# Patient Record
Sex: Female | Born: 1935 | Race: White | Hispanic: No | Smoking: Never smoker
Health system: Southern US, Community
[De-identification: ages and names within clinical notes are randomized; demographics above are authoritative.]

## PROBLEM LIST (undated history)

## (undated) DIAGNOSIS — I10 Essential (primary) hypertension: Secondary | ICD-10-CM

## (undated) DIAGNOSIS — I4891 Unspecified atrial fibrillation: Secondary | ICD-10-CM

## (undated) DIAGNOSIS — E039 Hypothyroidism, unspecified: Secondary | ICD-10-CM

## (undated) DIAGNOSIS — E785 Hyperlipidemia, unspecified: Secondary | ICD-10-CM

## (undated) DIAGNOSIS — S22000A Wedge compression fracture of unspecified thoracic vertebra, initial encounter for closed fracture: Secondary | ICD-10-CM

## (undated) HISTORY — PX: FRACTURE SURGERY: SHX138

## (undated) HISTORY — DX: Wedge compression fracture of unspecified thoracic vertebra, initial encounter for closed fracture: S22.000A

## (undated) HISTORY — PX: OTHER SURGICAL HISTORY: SHX169

## (undated) HISTORY — DX: Hyperlipidemia, unspecified: E78.5

## (undated) HISTORY — DX: Essential (primary) hypertension: I10

## (undated) HISTORY — DX: Unspecified atrial fibrillation: I48.91

## (undated) HISTORY — DX: Hypothyroidism, unspecified: E03.9

---
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
## 1968-04-13 HISTORY — PX: OTHER SURGICAL HISTORY: SHX169

## 1978-04-13 HISTORY — PX: SKIN CANCER EXCISION: SHX779

## 1992-04-13 DIAGNOSIS — E039 Hypothyroidism, unspecified: Secondary | ICD-10-CM

## 1992-04-13 HISTORY — DX: Hypothyroidism, unspecified: E03.9

## 1995-04-14 DIAGNOSIS — E785 Hyperlipidemia, unspecified: Secondary | ICD-10-CM

## 1995-04-14 HISTORY — DX: Hyperlipidemia, unspecified: E78.5

## 1998-06-27 ENCOUNTER — Other Ambulatory Visit: Admission: RE | Admit: 1998-06-27 | Discharge: 1998-06-27 | Payer: Self-pay | Admitting: Family Medicine

## 1999-05-15 HISTORY — PX: OTHER SURGICAL HISTORY: SHX169

## 2003-06-13 HISTORY — PX: DOPPLER ECHOCARDIOGRAPHY: SHX263

## 2003-09-27 HISTORY — PX: OTHER SURGICAL HISTORY: SHX169

## 2004-03-10 ENCOUNTER — Ambulatory Visit: Payer: Self-pay | Admitting: Family Medicine

## 2004-03-26 ENCOUNTER — Ambulatory Visit: Payer: Self-pay | Admitting: Family Medicine

## 2004-05-20 ENCOUNTER — Ambulatory Visit: Payer: Self-pay | Admitting: Family Medicine

## 2004-05-27 ENCOUNTER — Ambulatory Visit: Payer: Self-pay | Admitting: Family Medicine

## 2004-07-24 ENCOUNTER — Ambulatory Visit: Payer: Self-pay | Admitting: Family Medicine

## 2004-08-07 ENCOUNTER — Ambulatory Visit: Payer: Self-pay | Admitting: Family Medicine

## 2004-11-13 ENCOUNTER — Ambulatory Visit: Payer: Self-pay | Admitting: Family Medicine

## 2004-11-27 ENCOUNTER — Ambulatory Visit: Payer: Self-pay | Admitting: Family Medicine

## 2004-12-01 HISTORY — PX: OTHER SURGICAL HISTORY: SHX169

## 2004-12-05 ENCOUNTER — Encounter (INDEPENDENT_AMBULATORY_CARE_PROVIDER_SITE_OTHER): Payer: Self-pay | Admitting: Specialist

## 2004-12-05 ENCOUNTER — Inpatient Hospital Stay (HOSPITAL_COMMUNITY): Admission: RE | Admit: 2004-12-05 | Discharge: 2004-12-08 | Payer: Self-pay | Admitting: Gynecology

## 2004-12-30 ENCOUNTER — Ambulatory Visit: Payer: Self-pay | Admitting: Family Medicine

## 2004-12-31 ENCOUNTER — Ambulatory Visit: Payer: Self-pay | Admitting: Family Medicine

## 2005-01-30 ENCOUNTER — Ambulatory Visit: Payer: Self-pay | Admitting: Family Medicine

## 2005-04-01 ENCOUNTER — Ambulatory Visit: Payer: Self-pay | Admitting: Family Medicine

## 2005-04-15 ENCOUNTER — Ambulatory Visit: Payer: Self-pay | Admitting: Family Medicine

## 2005-05-06 ENCOUNTER — Ambulatory Visit: Payer: Self-pay | Admitting: Family Medicine

## 2005-08-24 ENCOUNTER — Ambulatory Visit: Payer: Self-pay | Admitting: Family Medicine

## 2005-09-24 ENCOUNTER — Ambulatory Visit: Payer: Self-pay | Admitting: Family Medicine

## 2005-12-08 ENCOUNTER — Ambulatory Visit: Payer: Self-pay | Admitting: Family Medicine

## 2005-12-28 ENCOUNTER — Ambulatory Visit: Payer: Self-pay | Admitting: Family Medicine

## 2006-01-06 ENCOUNTER — Ambulatory Visit: Payer: Self-pay | Admitting: Family Medicine

## 2006-02-24 ENCOUNTER — Emergency Department: Payer: Self-pay | Admitting: Unknown Physician Specialty

## 2006-02-24 HISTORY — PX: OTHER SURGICAL HISTORY: SHX169

## 2006-02-25 ENCOUNTER — Ambulatory Visit: Payer: Self-pay | Admitting: Family Medicine

## 2006-03-05 ENCOUNTER — Ambulatory Visit: Payer: Self-pay | Admitting: Unknown Physician Specialty

## 2006-06-14 ENCOUNTER — Ambulatory Visit: Payer: Self-pay | Admitting: Family Medicine

## 2006-07-18 ENCOUNTER — Encounter: Payer: Self-pay | Admitting: Family Medicine

## 2006-07-18 ENCOUNTER — Other Ambulatory Visit: Payer: Self-pay

## 2006-07-18 ENCOUNTER — Emergency Department: Payer: Self-pay | Admitting: Internal Medicine

## 2006-08-13 ENCOUNTER — Ambulatory Visit: Payer: Self-pay | Admitting: Family Medicine

## 2006-08-13 DIAGNOSIS — D508 Other iron deficiency anemias: Secondary | ICD-10-CM | POA: Insufficient documentation

## 2006-08-13 LAB — CONVERTED CEMR LAB
Glucose, Urine, Semiquant: NEGATIVE
HCT: 36.8 % (ref 36.0–46.0)
Ketones, urine, test strip: NEGATIVE
MCHC: 34.7 g/dL (ref 30.0–36.0)
MCV: 91.8 fL (ref 78.0–100.0)
Nitrite: NEGATIVE
Protein, U semiquant: NEGATIVE
WBC Urine, dipstick: NEGATIVE
WBC: 5.6 10*3/uL (ref 4.5–10.5)

## 2006-08-19 ENCOUNTER — Telehealth: Payer: Self-pay | Admitting: Family Medicine

## 2006-08-19 ENCOUNTER — Telehealth (INDEPENDENT_AMBULATORY_CARE_PROVIDER_SITE_OTHER): Payer: Self-pay | Admitting: *Deleted

## 2006-09-22 ENCOUNTER — Encounter: Payer: Self-pay | Admitting: Family Medicine

## 2006-09-22 DIAGNOSIS — N39490 Overflow incontinence: Secondary | ICD-10-CM | POA: Insufficient documentation

## 2006-09-23 ENCOUNTER — Ambulatory Visit: Payer: Self-pay | Admitting: Family Medicine

## 2006-09-23 DIAGNOSIS — E785 Hyperlipidemia, unspecified: Secondary | ICD-10-CM

## 2006-09-23 DIAGNOSIS — I10 Essential (primary) hypertension: Secondary | ICD-10-CM | POA: Insufficient documentation

## 2006-09-23 DIAGNOSIS — E039 Hypothyroidism, unspecified: Secondary | ICD-10-CM | POA: Insufficient documentation

## 2006-09-23 DIAGNOSIS — M810 Age-related osteoporosis without current pathological fracture: Secondary | ICD-10-CM | POA: Insufficient documentation

## 2006-09-23 LAB — CONVERTED CEMR LAB
Bilirubin Urine: NEGATIVE
Ketones, urine, test strip: NEGATIVE
Protein, U semiquant: NEGATIVE
Urobilinogen, UA: 1

## 2006-11-03 ENCOUNTER — Ambulatory Visit: Payer: Self-pay | Admitting: Family Medicine

## 2006-11-03 DIAGNOSIS — R1013 Epigastric pain: Secondary | ICD-10-CM | POA: Insufficient documentation

## 2006-11-04 ENCOUNTER — Encounter: Payer: Self-pay | Admitting: Family Medicine

## 2006-11-12 HISTORY — PX: TOTAL ABDOMINAL HYSTERECTOMY: SHX209

## 2006-11-26 ENCOUNTER — Telehealth (INDEPENDENT_AMBULATORY_CARE_PROVIDER_SITE_OTHER): Payer: Self-pay | Admitting: *Deleted

## 2006-12-06 ENCOUNTER — Ambulatory Visit: Payer: Self-pay | Admitting: Family Medicine

## 2006-12-13 HISTORY — PX: OTHER SURGICAL HISTORY: SHX169

## 2006-12-24 ENCOUNTER — Ambulatory Visit: Payer: Self-pay | Admitting: Family Medicine

## 2006-12-24 LAB — CONVERTED CEMR LAB
ALT: 15 units/L (ref 0–35)
Basophils Relative: 0.8 % (ref 0.0–1.0)
Bilirubin, Direct: 0.1 mg/dL (ref 0.0–0.3)
CO2: 31 meq/L (ref 19–32)
Calcium: 8.8 mg/dL (ref 8.4–10.5)
Eosinophils Absolute: 0.2 10*3/uL (ref 0.0–0.6)
Eosinophils Relative: 3.1 % (ref 0.0–5.0)
Free T4: 0.9 ng/dL (ref 0.6–1.6)
GFR calc Af Amer: 76 mL/min
GFR calc non Af Amer: 63 mL/min
Glucose, Bld: 93 mg/dL (ref 70–99)
Hemoglobin: 12.2 g/dL (ref 12.0–15.0)
Lymphocytes Relative: 26.5 % (ref 12.0–46.0)
MCV: 93.4 fL (ref 78.0–100.0)
Monocytes Absolute: 0.5 10*3/uL (ref 0.2–0.7)
Neutro Abs: 3 10*3/uL (ref 1.4–7.7)
Neutrophils Relative %: 60.2 % (ref 43.0–77.0)
Platelets: 178 10*3/uL (ref 150–400)
Potassium: 3.9 meq/L (ref 3.5–5.1)
Sodium: 142 meq/L (ref 135–145)
Total Protein: 6 g/dL (ref 6.0–8.3)
Triglycerides: 104 mg/dL (ref 0–149)
VLDL: 21 mg/dL (ref 0–40)
WBC: 5 10*3/uL (ref 4.5–10.5)

## 2006-12-27 ENCOUNTER — Ambulatory Visit: Payer: Self-pay | Admitting: Family Medicine

## 2006-12-27 DIAGNOSIS — R5383 Other fatigue: Secondary | ICD-10-CM

## 2006-12-27 DIAGNOSIS — M129 Arthropathy, unspecified: Secondary | ICD-10-CM | POA: Insufficient documentation

## 2006-12-27 DIAGNOSIS — R5381 Other malaise: Secondary | ICD-10-CM

## 2006-12-28 ENCOUNTER — Encounter: Payer: Self-pay | Admitting: Family Medicine

## 2006-12-30 ENCOUNTER — Encounter: Payer: Self-pay | Admitting: Family Medicine

## 2007-01-04 ENCOUNTER — Encounter: Admission: RE | Admit: 2007-01-04 | Discharge: 2007-01-04 | Payer: Self-pay | Admitting: Orthopaedic Surgery

## 2007-01-05 ENCOUNTER — Encounter: Admission: RE | Admit: 2007-01-05 | Discharge: 2007-01-05 | Payer: Self-pay | Admitting: Orthopaedic Surgery

## 2007-01-17 ENCOUNTER — Ambulatory Visit: Payer: Self-pay | Admitting: Family Medicine

## 2007-01-21 ENCOUNTER — Inpatient Hospital Stay (HOSPITAL_COMMUNITY): Admission: EM | Admit: 2007-01-21 | Discharge: 2007-01-25 | Payer: Self-pay | Admitting: Emergency Medicine

## 2007-01-24 ENCOUNTER — Encounter: Payer: Self-pay | Admitting: Family Medicine

## 2007-01-25 ENCOUNTER — Encounter: Payer: Self-pay | Admitting: Internal Medicine

## 2007-02-12 ENCOUNTER — Encounter: Payer: Self-pay | Admitting: Internal Medicine

## 2007-02-24 ENCOUNTER — Ambulatory Visit: Payer: Self-pay | Admitting: Family Medicine

## 2007-02-24 DIAGNOSIS — S72009A Fracture of unspecified part of neck of unspecified femur, initial encounter for closed fracture: Secondary | ICD-10-CM | POA: Insufficient documentation

## 2007-02-24 LAB — CONVERTED CEMR LAB
Basophils Absolute: 0 10*3/uL (ref 0.0–0.1)
Eosinophils Absolute: 0 10*3/uL (ref 0.0–0.6)
Eosinophils Relative: 0.7 % (ref 0.0–5.0)
HCT: 33.6 % — ABNORMAL LOW (ref 36.0–46.0)
MCV: 92.8 fL (ref 78.0–100.0)
Platelets: 271 10*3/uL (ref 150–400)
RBC: 3.62 M/uL — ABNORMAL LOW (ref 3.87–5.11)
RDW: 13.9 % (ref 11.5–14.6)
WBC: 6.1 10*3/uL (ref 4.5–10.5)

## 2007-05-12 ENCOUNTER — Ambulatory Visit: Payer: Self-pay | Admitting: Family Medicine

## 2007-05-12 DIAGNOSIS — K219 Gastro-esophageal reflux disease without esophagitis: Secondary | ICD-10-CM

## 2007-05-12 DIAGNOSIS — M549 Dorsalgia, unspecified: Secondary | ICD-10-CM | POA: Insufficient documentation

## 2007-05-12 LAB — CONVERTED CEMR LAB
Bilirubin Urine: NEGATIVE
Ketones, urine, test strip: NEGATIVE
Specific Gravity, Urine: 1.015
Urobilinogen, UA: 0.2

## 2007-05-15 DIAGNOSIS — S22000A Wedge compression fracture of unspecified thoracic vertebra, initial encounter for closed fracture: Secondary | ICD-10-CM

## 2007-05-15 HISTORY — DX: Wedge compression fracture of unspecified thoracic vertebra, initial encounter for closed fracture: S22.000A

## 2007-05-22 ENCOUNTER — Inpatient Hospital Stay (HOSPITAL_COMMUNITY): Admission: EM | Admit: 2007-05-22 | Discharge: 2007-05-26 | Payer: Self-pay | Admitting: Emergency Medicine

## 2007-05-22 ENCOUNTER — Ambulatory Visit: Payer: Self-pay | Admitting: Cardiology

## 2007-05-23 ENCOUNTER — Ambulatory Visit: Payer: Self-pay | Admitting: Vascular Surgery

## 2007-05-23 ENCOUNTER — Encounter: Payer: Self-pay | Admitting: Internal Medicine

## 2007-05-23 ENCOUNTER — Encounter: Payer: Self-pay | Admitting: Family Medicine

## 2007-05-23 HISTORY — PX: DOPPLER ECHOCARDIOGRAPHY: SHX263

## 2007-05-23 HISTORY — PX: OTHER SURGICAL HISTORY: SHX169

## 2007-05-24 ENCOUNTER — Ambulatory Visit: Payer: Self-pay | Admitting: Internal Medicine

## 2007-05-24 ENCOUNTER — Encounter: Payer: Self-pay | Admitting: Family Medicine

## 2007-05-26 ENCOUNTER — Encounter: Payer: Self-pay | Admitting: Family Medicine

## 2007-05-26 ENCOUNTER — Encounter: Payer: Self-pay | Admitting: Internal Medicine

## 2007-06-30 ENCOUNTER — Ambulatory Visit: Payer: Self-pay | Admitting: Family Medicine

## 2007-06-30 DIAGNOSIS — M8448XA Pathological fracture, other site, initial encounter for fracture: Secondary | ICD-10-CM | POA: Insufficient documentation

## 2007-07-01 ENCOUNTER — Encounter: Payer: Self-pay | Admitting: Family Medicine

## 2007-07-01 ENCOUNTER — Ambulatory Visit: Payer: Self-pay | Admitting: Pulmonary Disease

## 2007-08-02 ENCOUNTER — Ambulatory Visit: Payer: Self-pay | Admitting: Family Medicine

## 2007-08-02 LAB — CONVERTED CEMR LAB
BUN: 14 mg/dL (ref 6–23)
Basophils Relative: 0.1 % (ref 0.0–1.0)
CO2: 30 meq/L (ref 19–32)
Chloride: 103 meq/L (ref 96–112)
Creatinine, Ser: 0.8 mg/dL (ref 0.4–1.2)
Eosinophils Relative: 1.8 % (ref 0.0–5.0)
Glucose, Bld: 89 mg/dL (ref 70–99)
Hemoglobin: 12.2 g/dL (ref 12.0–15.0)
Lymphocytes Relative: 28.7 % (ref 12.0–46.0)
Neutrophils Relative %: 61.9 % (ref 43.0–77.0)
RBC: 3.89 M/uL (ref 3.87–5.11)
WBC: 5.4 10*3/uL (ref 4.5–10.5)

## 2007-08-03 ENCOUNTER — Other Ambulatory Visit: Payer: Self-pay

## 2007-08-03 ENCOUNTER — Ambulatory Visit: Payer: Self-pay | Admitting: Ophthalmology

## 2007-08-09 ENCOUNTER — Ambulatory Visit: Payer: Self-pay | Admitting: Family Medicine

## 2007-08-09 DIAGNOSIS — N3941 Urge incontinence: Secondary | ICD-10-CM

## 2007-08-15 ENCOUNTER — Ambulatory Visit: Payer: Self-pay | Admitting: Ophthalmology

## 2007-09-28 ENCOUNTER — Ambulatory Visit: Payer: Self-pay | Admitting: Ophthalmology

## 2007-10-10 ENCOUNTER — Ambulatory Visit: Payer: Self-pay | Admitting: Ophthalmology

## 2007-10-18 ENCOUNTER — Ambulatory Visit: Payer: Self-pay | Admitting: Family Medicine

## 2007-11-08 ENCOUNTER — Ambulatory Visit: Payer: Self-pay | Admitting: Family Medicine

## 2007-11-08 DIAGNOSIS — B029 Zoster without complications: Secondary | ICD-10-CM | POA: Insufficient documentation

## 2008-06-28 ENCOUNTER — Emergency Department (HOSPITAL_COMMUNITY): Admission: EM | Admit: 2008-06-28 | Discharge: 2008-06-28 | Payer: Self-pay | Admitting: Emergency Medicine

## 2008-06-28 ENCOUNTER — Encounter: Payer: Self-pay | Admitting: Family Medicine

## 2008-06-28 HISTORY — PX: CT HEAD LIMITED W/O CM: HXRAD127

## 2008-07-03 ENCOUNTER — Ambulatory Visit: Payer: Self-pay | Admitting: Family Medicine

## 2008-07-03 DIAGNOSIS — S20219A Contusion of unspecified front wall of thorax, initial encounter: Secondary | ICD-10-CM

## 2008-09-13 ENCOUNTER — Ambulatory Visit: Payer: Self-pay | Admitting: Family Medicine

## 2008-09-13 LAB — CONVERTED CEMR LAB
Bilirubin Urine: NEGATIVE
Ketones, urine, test strip: NEGATIVE
RBC / HPF: 0
Specific Gravity, Urine: <1.005
Urobilinogen, UA: 0.2
pH: 7

## 2008-09-14 ENCOUNTER — Encounter: Payer: Self-pay | Admitting: Family Medicine

## 2008-10-29 ENCOUNTER — Ambulatory Visit: Payer: Self-pay | Admitting: Family Medicine

## 2008-10-29 LAB — CONVERTED CEMR LAB
Albumin: 3.8 g/dL (ref 3.5–5.2)
Basophils Relative: 0.5 % (ref 0.0–3.0)
CO2: 31 meq/L (ref 19–32)
Chloride: 104 meq/L (ref 96–112)
Creatinine, Ser: 0.8 mg/dL (ref 0.4–1.2)
Eosinophils Absolute: 0.1 10*3/uL (ref 0.0–0.7)
Free T4: 0.9 ng/dL (ref 0.6–1.6)
Hemoglobin: 11.9 g/dL — ABNORMAL LOW (ref 12.0–15.0)
MCHC: 34.7 g/dL (ref 30.0–36.0)
MCV: 91.9 fL (ref 78.0–100.0)
Monocytes Absolute: 0.5 10*3/uL (ref 0.1–1.0)
Neutro Abs: 2.5 10*3/uL (ref 1.4–7.7)
Neutrophils Relative %: 53.8 % (ref 43.0–77.0)
RBC: 3.75 M/uL — ABNORMAL LOW (ref 3.87–5.11)
TSH: 4.95 microintl units/mL (ref 0.35–5.50)
Total CHOL/HDL Ratio: 3
Total Protein: 6.3 g/dL (ref 6.0–8.3)
Triglycerides: 86 mg/dL (ref 0.0–149.0)

## 2008-10-30 LAB — CONVERTED CEMR LAB: Vit D, 25-Hydroxy: 32 ng/mL (ref 30–89)

## 2008-11-05 ENCOUNTER — Ambulatory Visit: Payer: Self-pay | Admitting: Family Medicine

## 2008-11-05 LAB — CONVERTED CEMR LAB
Blood in Urine, dipstick: NEGATIVE
Ketones, urine, test strip: NEGATIVE
Nitrite: NEGATIVE
WBC Urine, dipstick: NEGATIVE

## 2008-11-21 ENCOUNTER — Ambulatory Visit: Payer: Self-pay | Admitting: Family Medicine

## 2008-12-13 ENCOUNTER — Ambulatory Visit: Payer: Self-pay | Admitting: Family Medicine

## 2008-12-13 LAB — CONVERTED CEMR LAB: OCCULT 3: NEGATIVE

## 2009-02-25 ENCOUNTER — Ambulatory Visit: Payer: Self-pay | Admitting: Family Medicine

## 2009-02-25 LAB — CONVERTED CEMR LAB
Bilirubin Urine: NEGATIVE
Ketones, urine, test strip: NEGATIVE
Urobilinogen, UA: 0.2
pH: 7

## 2009-02-26 ENCOUNTER — Encounter: Payer: Self-pay | Admitting: Family Medicine

## 2009-03-15 ENCOUNTER — Ambulatory Visit: Payer: Self-pay | Admitting: Family Medicine

## 2009-03-15 LAB — CONVERTED CEMR LAB
Ketones, urine, test strip: NEGATIVE
Nitrite: NEGATIVE
Protein, U semiquant: NEGATIVE
Urobilinogen, UA: 0.2

## 2009-04-10 ENCOUNTER — Ambulatory Visit: Payer: Self-pay | Admitting: Family Medicine

## 2009-05-22 ENCOUNTER — Ambulatory Visit: Payer: Self-pay | Admitting: Family Medicine

## 2009-05-22 DIAGNOSIS — R300 Dysuria: Secondary | ICD-10-CM | POA: Insufficient documentation

## 2009-05-22 LAB — CONVERTED CEMR LAB
Glucose, Urine, Semiquant: NEGATIVE
Ketones, urine, test strip: NEGATIVE
Nitrite: NEGATIVE
Specific Gravity, Urine: 1.02

## 2009-05-23 ENCOUNTER — Encounter: Payer: Self-pay | Admitting: Family Medicine

## 2009-06-05 ENCOUNTER — Ambulatory Visit: Payer: Self-pay | Admitting: Family Medicine

## 2009-06-05 LAB — CONVERTED CEMR LAB
Blood in Urine, dipstick: NEGATIVE
Glucose, Urine, Semiquant: NEGATIVE
Ketones, urine, test strip: NEGATIVE
Protein, U semiquant: NEGATIVE
Urobilinogen, UA: 0.2
WBC Urine, dipstick: NEGATIVE

## 2009-06-06 ENCOUNTER — Encounter: Payer: Self-pay | Admitting: Family Medicine

## 2009-09-11 ENCOUNTER — Ambulatory Visit: Payer: Self-pay | Admitting: Family Medicine

## 2009-09-11 DIAGNOSIS — R609 Edema, unspecified: Secondary | ICD-10-CM

## 2009-11-19 ENCOUNTER — Encounter (INDEPENDENT_AMBULATORY_CARE_PROVIDER_SITE_OTHER): Payer: Self-pay | Admitting: *Deleted

## 2010-01-23 ENCOUNTER — Ambulatory Visit: Payer: Self-pay | Admitting: Family Medicine

## 2010-05-13 NOTE — Assessment & Plan Note (Signed)
Summary: f/u/dlo   Vital Signs:  Patient profile:   77 year old female Weight:      98.75 pounds Temp:     97.0 degrees F oral Pulse rate:   76 / minute Pulse rhythm:   regular BP sitting:   136 / 70  (left arm) Cuff size:   regular  Vitals Entered By: Sydell Axon LPN (January 23, 2010 11:56 AM) CC: follow-up visit   History of Present Illness: Pt here with her son for followup. Has had some issue with swelling of her feet at times, typically when she has been sitting for periods of time. They have not been as bad lately and she has never had associated breathing problems.  She has also been having burning/water brash symptoms at times, typically after eating.  She has also had sxs of muscle weakness at times...typically after she has been up or out a while, most recent with transferring from seat to wheelchair...arms and legs got weak. She has had some intermittent burning with urination, no fever or chills, no acute fatigue. She does not feel infected but did "wonder if it could be her urine acting up again."    Problems Prior to Update: 1)  Pedal Edema  (ICD-782.3) 2)  Uti  (ICD-599.0) 3)  Accidental Falls, Recurrent  (ICD-E888.9) 4)  Contusion, Right Chest Wall  (ICD-922.1) 5)  Herpes Zoster  (ICD-053.9) 6)  Urge Incontinence  (ICD-788.31) 7)  Gerd  (ICD-530.81) 8)  Back Pain, Chronic  (ICD-724.5) 9)  Hip Fracture, Right  (ICD-820.8) 10)  Compression Fracture, L2 and T11 Vert  (ICD-733.13) 11)  Screening For Malignannt Neoplasm, Site Nec  (ICD-V76.49) 12)  Arthritis, Generalized  (ICD-716.99) 13)  Fatigue  (ICD-780.79) 14)  Symptom, Pain, Abdominal, Epigastric  (ICD-789.06) 15)  Symptom, Overflow Incontinence  (ICD-788.38) 16)  Osteoporosis  (ICD-733.00) 17)  Hypothyroidism  (ICD-244.9) 18)  Hypertension  (ICD-401.9) 19)  Hyperlipidemia  (ICD-272.4) 20)  Anemia, Iron Deficiency Nec  (ICD-280.8)  Medications Prior to Update: 1)  Levoxyl 50 Mcg Tabs (Levothyroxine  Sodium) .... Take 1 Tablet By Mouth Once A Day 2)  Evista 60 Mg Tabs (Raloxifene Hcl) .... Take 1 Tablet By Mouth Once A Day 3)  Metoprolol Tartrate 50 Mg Tabs (Metoprolol Tartrate) .... Take 1 Tablet By Mouth Twice A Day 4)  Ferrous Sulfate 325 (65 Fe) Mg Tabs (Ferrous Sulfate) .... Take 1 Tablet By Mouth Daily Per Patient 5)  Norvasc 5 Mg Tabs (Amlodipine Besylate) .... Take 1 Tablet By Mouth Twice  A Day 6)  B 12 1000 Mcq .... Take One By Mouth Daily 7)  Dulcolax Stool Softener 100 Mg  Caps (Docusate Sodium) .Marland Kitchen.. 1 At Bedtime By Mouth 8)  Multivitamins   Tabs (Multiple Vitamin) .Marland Kitchen.. 1 Daily By Mouth 9)  Lisinopril 20 Mg  Tabs (Lisinopril) .Marland Kitchen.. 1 Daily By Mouth 10)  Macrobid 100 Mg Caps (Nitrofurantoin Monohyd Macro) .... One Tab By Mouth Two Times A Day. 11)  Vitamin D3 400 Unit Tabs (Cholecalciferol) .... Take One By Mouth Daily  Allergies: 1)  ! Codeine 2)  ! Sulfa 3)  * Darvocet N 100  Physical Exam  General:  Well-developed,well-nourished,in no acute distress; alert,appropriate and cooperative throughout examination, able to move in her usual fashion. Happy and smiling. Walks haltingly with a cane. Head:  Normocephalic and atraumatic without obvious abnormalities. No apparent alopecia or balding. Eyes:  Conjunctiva clear bilaterally.  Ears:  External ear exam shows no significant lesions or deformities.  Otoscopic  examination reveals clear canals, tympanic membranes are intact bilaterally without bulging, retraction, inflammation or discharge. Hearing is grossly normal bilaterally. Nose:  External nasal examination shows no deformity or inflammation. Nasal mucosa are pink and moist without lesions or exudates. Mouth:  Oral mucosa and oropharynx without lesions or exudates.  Teeth in good repair. Neck:  No deformities, masses, or tenderness noted. Chest Wall:  No deformities, masses  noted. She is tender over the 6th rib ant axillary line on the right side of the chest. Lungs:   Normal respiratory effort, chest expands symmetrically. Lungs are clear to auscultation, no crackles or wheezes. Heart:  Normal rate and regular rhythm. Occas dropped beat. S1 and S2 normal without gallop, murmur, click, rub or other extra sounds. Abdomen:  No suprapubic discomfort. Msk:  No CVAT. Extremities:  No clubbing, cyanosis, edema, or deformity noted with normal full range of motion of all joints.  NO EDEMA appreciated. Psych:  Cognition and judgment appear intact. Alert and cooperative with normal attention span and concentration. No apparent delusions, illusions, hallucinations   Impression & Recommendations:  Problem # 1:  PEDAL EDEMA (ICD-782.3) Assessment Improved  None seen. Think her edema is mostly dependent in nature and can be controlled with position. Does not appear that Norvasc is responsible. Will follow.  Discussed elevation of the legs, use of compression stockings, sodium restiction, and medication use.   Problem # 2:  UTI (ICD-599.0) Assessment: Unchanged  do not think she has a urinary infection at  this point. Discussed. No CVAT, No suprapubic tenderness. The following medications were removed from the medication list:    Macrobid 100 Mg Caps (Nitrofurantoin monohyd macro) ..... One tab by mouth two times a day.  Encouraged to push clear liquids, get enough rest, and take acetaminophen as needed. To be seen in 10 days if no improvement, sooner if worse.  Problem # 3:  ACCIDENTAL FALLS, RECURRENT (ICD-E888.9) Assessment: Unchanged Recently had the carpet in her home replaced and hasn't fallen since.   Problem # 4:  GERD (ICD-530.81) Assessment: Unchanged  Discussed pathophysiology of problem. Now knows to avoid tomato products. She uses Maalox for acute sxs. Continue. May need to start Zantac.  Diagnostics Reviewed:  Discussed lifestyle modifications, diet, antacids/medications, and preventive measures. Handout provided.   Problem # 5:  FATIGUE  (ICD-780.79) Assessment: Unchanged Really think transfer problems after activity is from decompensation and not physiologic.  If develops chest pain, to ER for eval.  Problem # 6:  HYPERTENSION (ICD-401.9) Assessment: Unchanged Stable and adequate. Cont curr meds. Her updated medication list for this problem includes:    Metoprolol Tartrate 50 Mg Tabs (Metoprolol tartrate) .Marland Kitchen... Take 1 tablet by mouth twice a day    Norvasc 5 Mg Tabs (Amlodipine besylate) .Marland Kitchen... Take 1 tablet by mouth twice  a day    Lisinopril 20 Mg Tabs (Lisinopril) .Marland Kitchen... 1 daily by mouth  BP today: 136/70 Prior BP: 140/78 (09/11/2009)  Labs Reviewed: K+: 4.3 (10/29/2008) Creat: : 0.8 (10/29/2008)   Chol: 177 (10/29/2008)   HDL: 53.30 (10/29/2008)   LDL: 107 (10/29/2008)   TG: 86.0 (10/29/2008)  Complete Medication List: 1)  Levoxyl 50 Mcg Tabs (Levothyroxine sodium) .... Take 1 tablet by mouth once a day 2)  Evista 60 Mg Tabs (Raloxifene hcl) .... Take 1 tablet by mouth once a day 3)  Metoprolol Tartrate 50 Mg Tabs (Metoprolol tartrate) .... Take 1 tablet by mouth twice a day 4)  Ferrous Sulfate 325 (65 Fe) Mg Tabs (Ferrous  sulfate) .... Take 1 tablet by mouth daily per patient 5)  Norvasc 5 Mg Tabs (Amlodipine besylate) .... Take 1 tablet by mouth twice  a day 6)  B 12 1000 Mcq  .... Take one by mouth daily 7)  Dulcolax Stool Softener 100 Mg Caps (Docusate sodium) .Marland Kitchen.. 1 at bedtime by mouth 8)  Multivitamins Tabs (Multiple vitamin) .Marland Kitchen.. 1 daily by mouth 9)  Lisinopril 20 Mg Tabs (Lisinopril) .Marland Kitchen.. 1 daily by mouth 10)  Vitamin D3 400 Unit Tabs (Cholecalciferol) .... Take one by mouth daily  Other Orders: Flu Vaccine 37yrs + MEDICARE PATIENTS (Z6109) Administration Flu vaccine - MCR (U0454)  Patient Instructions: 1)  RTC 4 months  Current Allergies (reviewed today): ! CODEINE ! SULFA * DARVOCET N 100  Flu Vaccine Consent Questions     Do you have a history of severe allergic reactions to this vaccine?  no    Any prior history of allergic reactions to egg and/or gelatin? no    Do you have a sensitivity to the preservative Thimersol? no    Do you have a past history of Guillan-Barre Syndrome? no    Do you currently have an acute febrile illness? no    Have you ever had a severe reaction to latex? no    Vaccine information given and explained to patient? yes    Are you currently pregnant? no    Lot Number:[id number]   Exp Date:10/11/2010   Site Given  Left Deltoid [id number]

## 2010-05-13 NOTE — Assessment & Plan Note (Signed)
Summary: 6 WEEK FOLLOW UP/URINE SAMPLE/[facility]   Vital Signs:  Patient profile:   77 year old female Weight:      97.75 pounds Temp:     97.8 degrees F oral Pulse rate:   80 / minute Pulse rhythm:   regular BP sitting:   140 / 78  (left arm) Cuff size:   regular  Vitals Entered By: Sydell Axon LPN (May 22, 2009 9:54 AM) CC: Follow-up, check urine, some burning and pain   History of Present Illness: Pt here with son for followup. She has burning with urination again. Her arthritis is acting up again causing her to walk very slowly altho she is able to get around.  She had skin cancers removed 4-5 mos ago and had to take Zpak x2 to get the sites to heal. This would have been about the time she had the Proteus infection and it was resistant to Macrobid. She had had no UTIs during the time of healing (the time on the Zpaks) but the resistant UTI was then c/w when she had finished the run of Abs. She has not fallen lately but can get unstable easily.  Problems Prior to Update: 1)  Accidental Falls, Recurrent  (ICD-E888.9) 2)  Contusion, Right Chest Wall  (ICD-922.1) 3)  Herpes Zoster  (ICD-053.9) 4)  Urge Incontinence  (ICD-788.31) 5)  Gerd  (ICD-530.81) 6)  Back Pain, Chronic  (ICD-724.5) 7)  Hip Fracture, Right  (ICD-820.8) 8)  Compression Fracture, L2 and T11 Vert  (ICD-733.13) 9)  Screening For Malignannt Neoplasm, Site Nec  (ICD-V76.49) 10)  Arthritis, Generalized  (ICD-716.99) 11)  Fatigue  (ICD-780.79) 12)  Symptom, Pain, Abdominal, Epigastric  (ICD-789.06) 13)  Symptom, Overflow Incontinence  (ICD-788.38) 14)  Osteoporosis  (ICD-733.00) 15)  Hypothyroidism  (ICD-244.9) 16)  Hypertension  (ICD-401.9) 17)  Hyperlipidemia  (ICD-272.4) 18)  Anemia, Iron Deficiency Nec  (ICD-280.8)  Medications Prior to Update: 1)  Levoxyl 50 Mcg Tabs (Levothyroxine Sodium) .... Take 1 Tablet By Mouth Once A Day 2)  Evista 60 Mg Tabs (Raloxifene Hcl) .... Take 1 Tablet By Mouth Once A  Day 3)  Metoprolol Tartrate 50 Mg Tabs (Metoprolol Tartrate) .... Take 1 Tablet By Mouth Twice A Day 4)  Ferrous Sulfate 325 (65 Fe) Mg Tabs (Ferrous Sulfate) .... Take 1 Tablet By Mouth Daily Per Patient 5)  Norvasc 5 Mg Tabs (Amlodipine Besylate) .... Take 1 Tablet By Mouth Twice  A Day 6)  B 12 1000 Mcq .... Take One By Mouth Daily 7)  Vitamin D .... 400 Iu 8)  Dulcolax Stool Softener 100 Mg  Caps (Docusate Sodium) .Marland Kitchen.. 1 At Bedtime By Mouth 9)  Multivitamins   Tabs (Multiple Vitamin) .Marland Kitchen.. 1 Daily By Mouth 10)  Lisinopril 20 Mg  Tabs (Lisinopril) .Marland Kitchen.. 1 Daily By Mouth  Allergies: 1)  ! Codeine 2)  ! Sulfa 3)  * Darvocet N 100  Physical Exam  General:  Well-developed,well-nourished,in no acute distress; alert,appropriate and cooperative throughout examination, able to move in her usual fashion. Walks haltingly with a cane. Abdomen:  Minimal suprapubic discomfort. Msk:  No CVAT.   Impression & Recommendations:  Problem # 1:  UTI (ICD-599.0) Assessment New  Again recurrent. Has not been on Abs real recently. Will hope this her usual E. Coli pan sens. Will try Macrobid two times a day for 10 days, stop for four days and get TOC urine culture, then on prophylactic dose of Macrobid o0nce a day for perhaps 6 months.  Will culture urine today. Her updated medication list for this problem includes:    Macrobid 100 Mg Caps (Nitrofurantoin monohyd macro) ..... One tab by mouth two times a day.  Encouraged to push clear liquids, get enough rest, and take acetaminophen as needed. To be seen in 10 days if no improvement, sooner if worse.  Orders: UA Dipstick W/ Micro (manual) (09811)  Complete Medication List: 1)  Levoxyl 50 Mcg Tabs (Levothyroxine sodium) .... Take 1 tablet by mouth once a day 2)  Evista 60 Mg Tabs (Raloxifene hcl) .... Take 1 tablet by mouth once a day 3)  Metoprolol Tartrate 50 Mg Tabs (Metoprolol tartrate) .... Take 1 tablet by mouth twice a day 4)  Ferrous Sulfate  325 (65 Fe) Mg Tabs (Ferrous sulfate) .... Take 1 tablet by mouth daily per patient 5)  Norvasc 5 Mg Tabs (Amlodipine besylate) .... Take 1 tablet by mouth twice  a day 6)  B 12 1000 Mcq  .... Take one by mouth daily 7)  Vitamin D  .... 400 iu 8)  Dulcolax Stool Softener 100 Mg Caps (Docusate sodium) .Marland Kitchen.. 1 at bedtime by mouth 9)  Multivitamins Tabs (Multiple vitamin) .Marland Kitchen.. 1 daily by mouth 10)  Lisinopril 20 Mg Tabs (Lisinopril) .Marland Kitchen.. 1 daily by mouth 11)  Macrobid 100 Mg Caps (Nitrofurantoin monohyd macro) .... One tab by mouth two times a day.  Patient Instructions: 1)  Take Macrobid for ten days. 2)  Give urine sample in 2 weeks.  Prescriptions: MACROBID 100 MG CAPS (NITROFURANTOIN MONOHYD MACRO) one tab by mouth two times a day.  #20 x 0   Entered and Authorized by:   Shaune Leeks MD   Signed by:   Shaune Leeks MD on 05/22/2009   Method used:   Print then Give to Patient   RxID:   [id number]   Current Allergies (reviewed today): ! CODEINE ! SULFA * DARVOCET N 100  Laboratory Results   Urine Tests  Date/Time Received: May 22, 2009 10:16 AM  Date/Time Reported: May 22, 2009 10:16 AM   Routine Urinalysis   Color: yellow Appearance: Cloudy Glucose: negative   (Normal Range: Negative) Bilirubin: negative   (Normal Range: Negative) Ketone: negative   (Normal Range: Negative) Spec. Gravity: 1.020   (Normal Range: 1.003-1.035) Blood: moderate   (Normal Range: Negative) pH: 6.5   (Normal Range: 5.0-8.0) Protein: trace   (Normal Range: Negative) Urobilinogen: 0.2   (Normal Range: 0-1) Nitrite: negative   (Normal Range: Negative) Leukocyte Esterace: moderate   (Normal Range: Negative)        Appended Document: 6 WEEK FOLLOW UP/URINE SAMPLE/[facility]

## 2010-05-13 NOTE — Assessment & Plan Note (Signed)
Summary: F/U,SWOLLEN FEET/CLE   Vital Signs:  Patient profile:   77 year old female Weight:      100.25 pounds BMI:     22.56 Temp:     98.1 degrees F oral Pulse rate:   84 / minute Pulse rhythm:   regular BP sitting:   140 / 78  (left arm) Cuff size:   regular  Vitals Entered By: Sydell Axon LPN (September 11, 1608 2:29 PM) CC: Swollen feet and ankles, right one worse   History of Present Illness: Pt here with her son for occas swollen feet. She is somewhat sedentary at times and realizes her sxs are c/w these times. She does not have swelling all the time and has minimal to no swelling today. She has no difficulty breathing. She typically sits in a recliner when sitting. She otherwise feels well and has no other complaints.  Problems Prior to Update: 1)  Uti  (ICD-599.0) 2)  Accidental Falls, Recurrent  (ICD-E888.9) 3)  Contusion, Right Chest Wall  (ICD-922.1) 4)  Herpes Zoster  (ICD-053.9) 5)  Urge Incontinence  (ICD-788.31) 6)  Gerd  (ICD-530.81) 7)  Back Pain, Chronic  (ICD-724.5) 8)  Hip Fracture, Right  (ICD-820.8) 9)  Compression Fracture, L2 and T11 Vert  (ICD-733.13) 10)  Screening For Malignannt Neoplasm, Site Nec  (ICD-V76.49) 11)  Arthritis, Generalized  (ICD-716.99) 12)  Fatigue  (ICD-780.79) 13)  Symptom, Pain, Abdominal, Epigastric  (ICD-789.06) 14)  Symptom, Overflow Incontinence  (ICD-788.38) 15)  Osteoporosis  (ICD-733.00) 16)  Hypothyroidism  (ICD-244.9) 17)  Hypertension  (ICD-401.9) 18)  Hyperlipidemia  (ICD-272.4) 19)  Anemia, Iron Deficiency Nec  (ICD-280.8)  Medications Prior to Update: 1)  Levoxyl 50 Mcg Tabs (Levothyroxine Sodium) .... Take 1 Tablet By Mouth Once A Day 2)  Evista 60 Mg Tabs (Raloxifene Hcl) .... Take 1 Tablet By Mouth Once A Day 3)  Metoprolol Tartrate 50 Mg Tabs (Metoprolol Tartrate) .... Take 1 Tablet By Mouth Twice A Day 4)  Ferrous Sulfate 325 (65 Fe) Mg Tabs (Ferrous Sulfate) .... Take 1 Tablet By Mouth Daily Per Patient 5)   Norvasc 5 Mg Tabs (Amlodipine Besylate) .... Take 1 Tablet By Mouth Twice  A Day 6)  B 12 1000 Mcq .... Take One By Mouth Daily 7)  Vitamin D .... 400 Iu 8)  Dulcolax Stool Softener 100 Mg  Caps (Docusate Sodium) .Marland Kitchen.. 1 At Bedtime By Mouth 9)  Multivitamins   Tabs (Multiple Vitamin) .Marland Kitchen.. 1 Daily By Mouth 10)  Lisinopril 20 Mg  Tabs (Lisinopril) .Marland Kitchen.. 1 Daily By Mouth 11)  Macrobid 100 Mg Caps (Nitrofurantoin Monohyd Macro) .... One Tab By Mouth Two Times A Day.  Allergies: 1)  ! Codeine 2)  ! Sulfa 3)  * Darvocet N 100  Physical Exam  General:  Well-developed,well-nourished,in no acute distress; alert,appropriate and cooperative throughout examination, able to move in her usual fashion. Happy and smiling. Walks haltingly with a cane. Head:  Normocephalic and atraumatic without obvious abnormalities. No apparent alopecia or balding. Eyes:  Conjunctiva clear bilaterally.  Ears:  External ear exam shows no significant lesions or deformities.  Otoscopic examination reveals clear canals, tympanic membranes are intact bilaterally without bulging, retraction, inflammation or discharge. Hearing is grossly normal bilaterally. Nose:  External nasal examination shows no deformity or inflammation. Nasal mucosa are pink and moist without lesions or exudates. Mouth:  Oral mucosa and oropharynx without lesions or exudates.  Teeth in good repair. Neck:  No deformities, masses, or tenderness noted.  Lungs:  Normal respiratory effort, chest expands symmetrically. Lungs are clear to auscultation, no crackles or wheezes. Heart:  Normal rate and regular rhythm. Occas dropped beat. S1 and S2 normal without gallop, murmur, click, rub or other extra sounds. Abdomen:  Minimal suprapubic discomfort. Extremities:  No clubbing, cyanosis, edema, or deformity noted with normal full range of motion of all joints.  NO EDEMA appreciated.   Impression & Recommendations:  Problem # 1:  PEDAL EDEMA (ICD-782.3) Assessment  New  Mild and waxing/waning. Discussed leg elevation ABOVE THE LEVEL OF THE HEART. Avoid salt, which she tries to do. Avoid diuretics if poss.  Discussed elevation of the legs, use of compression stockings, sodium restiction, and medication use.   Problem # 2:  UTI (ICD-599.0) Assessment: Unchanged No sxs today. Will not send urine altho she brought a sample because she has such a hard time giving one at times. Her updated medication list for this problem includes:    Macrobid 100 Mg Caps (Nitrofurantoin monohyd macro) ..... One tab by mouth two times a day.  Problem # 3:  HYPERTENSION (ICD-401.9) Assessment: Unchanged Stable. Discussed meds. Her updated medication list for this problem includes:    Metoprolol Tartrate 50 Mg Tabs (Metoprolol tartrate) .Marland Kitchen... Take 1 tablet by mouth twice a day    Norvasc 5 Mg Tabs (Amlodipine besylate) .Marland Kitchen... Take 1 tablet by mouth twice  a day    Lisinopril 20 Mg Tabs (Lisinopril) .Marland Kitchen... 1 daily by mouth  BP today: 140/78 Prior BP: 140/78 (05/22/2009)  Labs Reviewed: K+: 4.3 (10/29/2008) Creat: : 0.8 (10/29/2008)   Chol: 177 (10/29/2008)   HDL: 53.30 (10/29/2008)   LDL: 107 (10/29/2008)   TG: 86.0 (10/29/2008)  Complete Medication List: 1)  Levoxyl 50 Mcg Tabs (Levothyroxine sodium) .... Take 1 tablet by mouth once a day 2)  Evista 60 Mg Tabs (Raloxifene hcl) .... Take 1 tablet by mouth once a day 3)  Metoprolol Tartrate 50 Mg Tabs (Metoprolol tartrate) .... Take 1 tablet by mouth twice a day 4)  Ferrous Sulfate 325 (65 Fe) Mg Tabs (Ferrous sulfate) .... Take 1 tablet by mouth daily per patient 5)  Norvasc 5 Mg Tabs (Amlodipine besylate) .... Take 1 tablet by mouth twice  a day 6)  B 12 1000 Mcq  .... Take one by mouth daily 7)  Dulcolax Stool Softener 100 Mg Caps (Docusate sodium) .Marland Kitchen.. 1 at bedtime by mouth 8)  Multivitamins Tabs (Multiple vitamin) .Marland Kitchen.. 1 daily by mouth 9)  Lisinopril 20 Mg Tabs (Lisinopril) .Marland Kitchen.. 1 daily by mouth 10)  Macrobid  100 Mg Caps (Nitrofurantoin monohyd macro) .... One tab by mouth two times a day. 11)  Vitamin D3 400 Unit Tabs (Cholecalciferol) .... Take one by mouth daily  Patient Instructions: 1)  RTC as needed. Prescriptions: LISINOPRIL 20 MG  TABS (LISINOPRIL) 1 daily by mouth  #90 Each x 2   Entered and Authorized by:   Shaune Leeks MD   Signed by:   Shaune Leeks MD on 09/11/2009   Method used:   Electronically to        [facility] [address] (retail)       [address], [address]       [place], [place]  [address]       Ph: [phone]       Fax: [phone]   RxID:   [id number] NORVASC 5 MG TABS (AMLODIPINE BESYLATE) Take 1 tablet  by mouth twice  a day  #180 x 3   Entered and Authorized by:   Shaune Leeks MD   Signed by:   Shaune Leeks MD on 09/11/2009   Method used:   Electronically to        [facility] [address] (retail)       [address], [address]       [place], [place]  [address]       Ph: [phone]       Fax: [phone]   RxID:   [id number] FERROUS SULFATE 325 (65 FE) MG TABS (FERROUS SULFATE) Take 1 tablet by mouth daily per patient  #90 x 3   Entered and Authorized by:   Shaune Leeks MD   Signed by:   Shaune Leeks MD on 09/11/2009   Method used:   Electronically to        [facility] [address] (retail)       [address], [address]       [place], [place]  [address]       Ph: [phone]       Fax: [phone]   RxID:   [id number] METOPROLOL TARTRATE 50 MG TABS (METOPROLOL TARTRATE) Take 1 tablet by mouth twice a day  #180 Each x 3   Entered and Authorized by:   Shaune Leeks MD   Signed by:   Shaune Leeks MD on 09/11/2009   Method used:   Electronically to        [facility] [address] (retail)       [address], [address]       [place], [place]  [address]        Ph: [phone]       Fax: [phone]   RxID:   [id number] EVISTA 60 MG TABS (RALOXIFENE HCL) Take 1 tablet by mouth once a day  #90 x 4   Entered and Authorized by:   Shaune Leeks MD   Signed by:   Shaune Leeks MD on 09/11/2009   Method used:   Electronically to        [facility] [address] (retail)       [address], [address]       [place], [place]  [address]       Ph: [phone]       Fax: [phone]   RxID:   [id number] LEVOXYL 50 MCG TABS (LEVOTHYROXINE SODIUM) Take 1 tablet by mouth once a day  #90 Each x 3   Entered and Authorized by:   Shaune Leeks MD   Signed by:   Shaune Leeks MD on 09/11/2009   Method used:   Electronically to        [facility] [address] (retail)       [address], [address]       [place], [place]  [address]       Ph: [phone]       Fax: [phone]   RxID:   [id number]   Current Allergies (reviewed today): ! CODEINE ! SULFA * DARVOCET N 100  Appended Document: F/U,SWOLLEN FEET/CLE    Clinical Lists Changes  Orders: Added new Service order of Prescription Created Electronically [phone]) - Signed

## 2010-05-13 NOTE — Assessment & Plan Note (Signed)
Summary: UTI  CYD  Nurse Visit   Allergies: 1)  ! Codeine 2)  ! Sulfa 3)  * Darvocet N 100 Laboratory Results   Urine Tests  Date/Time Received: June 05, 2009 1:12 PM  Date/Time Reported: June 05, 2009 1:12 PM   Routine Urinalysis   Color: yellow Appearance: Clear Glucose: negative   (Normal Range: Negative) Bilirubin: negative   (Normal Range: Negative) Ketone: negative   (Normal Range: Negative) Spec. Gravity: 1.020   (Normal Range: 1.003-1.035) Blood: negative   (Normal Range: Negative) pH: 6.0   (Normal Range: 5.0-8.0) Protein: negative   (Normal Range: Negative) Urobilinogen: 0.2   (Normal Range: 0-1) Nitrite: negative   (Normal Range: Negative) Leukocyte Esterace: negative   (Normal Range: Negative)  Urine Microscopic WBC/HPF: 0-1 RBC/HPF: 0-1 Bacteria/HPF: 1+ Epithelial/HPF: Rare       Orders Added: 1)  Specimen Handling [99000] 2)  T-Culture, Urine [[id number]] 3)  UA Dipstick W/ Micro (manual) [[id number]] Please send culture. Shaune Leeks MD  June 05, 2009 1:40 PM  Culture sent. Sydell Axon LPN  June 05, 2009 1:55 PM

## 2010-05-13 NOTE — Letter (Signed)
Summary: Nadara Eaton letter  [place] [facility]  [address] [place], [place] [address]   Phone: [phone]  Fax: [phone]       11/19/2009 MRN: [id number]  [facility] [address] [place], [place]  [address]  Dear Ms. [facility] [place] and [place] announce the retirement of Arta Silence, M.D., from full-time practice at the [facility] office effective October 10, 2009 and his plans of returning part-time.  It is important to Dr. Hetty Ely and to our practice that you understand that [facility] has seven physicians in our office for your health care needs.  We will continue to offer the same exceptional care that you have today.    Dr. Hetty Ely has spoken to many of you about his plans for retirement and returning part-time in the fall.   We will continue to work with you through the transition to schedule appointments for you in the office and meet the high standards that [place] is committed to.   Again, it is with great pleasure that we share the news that Dr. Hetty Ely will return to [facility] in October of 2011 with a reduced schedule.    If you have any questions, or would like to request an appointment with one of our physicians, please call us at [phone] and press the option for Scheduling an appointment.  We take pleasure in providing you with excellent patient care and look forward to seeing you at your next office visit.  Our [facility] Physicians are:  Tillman Abide, M.D. Laurita Quint, M.D. Roxy Manns, M.D. Kerby Nora, M.D. Hannah Beat, M.D. Ruthe Mannan, M.D. We proudly welcomed Raechel Ache, M.D. and Eustaquio Boyden, M.D. to the practice in July/August 2011.  Sincerely,  [facility]

## 2010-05-28 ENCOUNTER — Encounter: Payer: Self-pay | Admitting: Family Medicine

## 2010-05-28 ENCOUNTER — Ambulatory Visit (INDEPENDENT_AMBULATORY_CARE_PROVIDER_SITE_OTHER): Payer: Medicare Other | Admitting: Family Medicine

## 2010-05-28 DIAGNOSIS — R300 Dysuria: Secondary | ICD-10-CM

## 2010-05-28 DIAGNOSIS — R609 Edema, unspecified: Secondary | ICD-10-CM

## 2010-05-28 DIAGNOSIS — K219 Gastro-esophageal reflux disease without esophagitis: Secondary | ICD-10-CM

## 2010-06-04 NOTE — Assessment & Plan Note (Addendum)
Summary: 4 MONTH FU/RBH   Vital Signs:  Patient profile:   77 year old female Weight:      100.25 pounds Temp:     97.9 degrees F oral Pulse rate:   68 / minute Pulse rhythm:   regular BP sitting:   146 / 76  (left arm) Cuff size:   regular  Vitals Entered By: Sydell Axon LPN (May 28, 2010 10:21 AM) CC: 4 Month follow-up, acid reflux, burning with urination   History of Present Illness: Pt here for followup with her son. She has had urinary burning for a few weeks, drinking cranberry juice and  tea. HEr urine is typically dark yellow.  She is having burning in the xyphoid area. She gets GERD daily, once a day. She gets relief with Maalox. Sometimes just a sip of water will cause it. It is once a day and shortlived if she uses the Maalox.  Allergies: 1)  ! Codeine 2)  ! Sulfa 3)  * Darvocet N 100   Impression & Recommendations:  Problem # 1:  DYSURIA, CHRONIC (ICD-788.1) Assessment New  Really think her burning is probably from cocentration of the urine and does not seem to be an infection. Pt was unable to give sample today so will treat symptomatically with Pyridium and wait on a sample they will bring in. I also suggested she try to drink more fluids as the concentration of her urine can cause burning on its own. Her updated medication list for this problem includes:    Pyridium 200 Mg Tabs (Phenazopyridine hcl) ..... One tab by mouth three times a day as needed for urinary burning  Encouraged to push clear liquids, get enough rest, and take acetaminophen as needed. To be seen in 10 days if no improvement, sooner if worse.  Problem # 2:  PEDAL EDEMA (ICD-782.3) Assessment: Unchanged Stable. Cont currr meds.  Problem # 3:  ACCIDENTAL FALLS, RECURRENT (ICD-E888.9) Assessment: Unchanged None since having the new carpet.  Problem # 4:  GERD (ICD-530.81) Assessment: Deteriorated  Having sxs daily but worsened by posture. With sxs only once a day and responsive,  would continue symptomatic Maalox rather than going to a medication that would be more chronic and more expensive. Cont Maalox as needed.  Diagnostics Reviewed:  Discussed lifestyle modifications, diet, antacids/medications, and preventive measures. Handout provided.   Complete Medication List: 1)  Levoxyl 50 Mcg Tabs (Levothyroxine sodium) .... Take 1 tablet by mouth once a day 2)  Evista 60 Mg Tabs (Raloxifene hcl) .... Take 1 tablet by mouth once a day 3)  Metoprolol Tartrate 50 Mg Tabs (Metoprolol tartrate) .... Take 1 tablet by mouth twice a day 4)  Ferrous Sulfate 325 (65 Fe) Mg Tabs (Ferrous sulfate) .... Take 1 tablet by mouth daily per patient 5)  Norvasc 5 Mg Tabs (Amlodipine besylate) .... Take 1 tablet by mouth twice  a day 6)  B 12 1000 Mcq  .... Take one by mouth daily 7)  Dulcolax Stool Softener 100 Mg Caps (Docusate sodium) .Marland Kitchen.. 1 at bedtime by mouth 8)  Multivitamins Tabs (Multiple vitamin) .Marland Kitchen.. 1 daily by mouth 9)  Lisinopril 20 Mg Tabs (Lisinopril) .Marland Kitchen.. 1 daily by mouth 10)  Vitamin D3 400 Unit Tabs (Cholecalciferol) .... Take one by mouth daily 11)  Pyridium 200 Mg Tabs (Phenazopyridine hcl) .... One tab by mouth three times a day as needed for urinary burning  Patient Instructions: 1)  RTC 4 mos Prescriptions: PYRIDIUM 200 MG TABS (PHENAZOPYRIDINE HCL)  one tab by mouth three times a day as needed for urinary burning  #30 x 0   Entered and Authorized by:   Shaune Leeks MD   Signed by:   Shaune Leeks MD on 05/28/2010   Method used:   Electronically to        [facility]  [address] (retail)       [address], [address]       [place], [place]  [address]       Ph: [phone]       Fax: [phone]   RxID:   [id number]    Orders Added: 1)  Est. Patient Level III [44010]    Current Allergies (reviewed today): ! CODEINE ! SULFA * DARVOCET N 100  Appended Document: 4 MONTH FU/RBH    Clinical Lists  Changes  Observations: Added new observation of PSYCH COMM: Cognition and judgment appear intact. Alert and cooperative with normal attention span and concentration. No apparent delusions, illusions, hallucinations (06/02/2010 11:26) Added new observation of MSK EXAM: No CVAT. (06/02/2010 11:26) Added new observation of ABDOMEN EXAM: Bowel sounds positive,abdomen soft and non-tender without masses, organomegaly or hernias noted. No suprapubic tenderness. (06/02/2010 11:26) Added new observation of HEART EXAM: Normal rate and regular rhythm. Occas dropped beat. S1 and S2 normal without gallop, murmur, click, rub or other extra sounds. (06/02/2010 11:26) Added new observation of LUNG EXAM: Normal respiratory effort, chest expands symmetrically. Lungs are clear to auscultation, no crackles or wheezes. (06/02/2010 11:26) Added new observation of NECK EXAM: No deformities, masses, or tenderness noted. (06/02/2010 11:26) Added new observation of ORAL EXAM: Oral mucosa and oropharynx without lesions or exudates.  Teeth in good repair. (06/02/2010 11:26) Added new observation of NOSE EXAM: External nasal examination shows no deformity or inflammation. Nasal mucosa are pink and moist without lesions or exudates. (06/02/2010 11:26) Added new observation of EAR EXAM: External ear exam shows no significant lesions or deformities.  Otoscopic examination reveals clear canals, tympanic membranes are intact bilaterally without bulging, retraction, inflammation or discharge. Hearing is grossly normal bilaterally. (06/02/2010 11:26) Added new observation of EYE EXAM: Conjunctiva clear bilaterally.  (06/02/2010 11:26) Added new observation of HD/FACE INSP: Normocephalic and atraumatic without obvious abnormalities. No apparent alopecia or balding. (06/02/2010 11:26) Added new observation of PEADULT: Shaune Leeks MD ~General`Gen appear ~Head`hd/face insp ~Eyes`Eye exam ~Ears`Ear exam ~Nose`Nose exam ~Mouth`Oral  exam ~Neck`NECK EXAM ~Lungs`lung exam ~Heart`Heart exam ~Abdomen`Abdomen exam ~Msk`MSK EXAM ~Psych`psych comm (06/02/2010 11:26) Added new observation of GEN APPEAR: Well-developed,well-nourished,in no acute distress; alert,appropriate and cooperative throughout examination, able to move in her usual fashion, kyphotic and stooped, slow moving. Happy and smiling. Walks haltingly with a cane. (06/02/2010 11:26)        Physical Exam  General:  Well-developed,well-nourished,in no acute distress; alert,appropriate and cooperative throughout examination, able to move in her usual fashion, kyphotic and stooped, slow moving. Happy and smiling. Walks haltingly with a cane. Head:  Normocephalic and atraumatic without obvious abnormalities. No apparent alopecia or balding. Eyes:  Conjunctiva clear bilaterally.  Ears:  External ear exam shows no significant lesions or deformities.  Otoscopic examination reveals clear canals, tympanic membranes are intact bilaterally without bulging, retraction, inflammation or discharge. Hearing is grossly normal bilaterally. Nose:  External nasal examination shows no deformity or inflammation. Nasal mucosa are pink and moist without lesions or exudates. Mouth:  Oral mucosa and oropharynx without lesions or exudates.  Teeth in good repair.  Neck:  No deformities, masses, or tenderness noted. Lungs:  Normal respiratory effort, chest expands symmetrically. Lungs are clear to auscultation, no crackles or wheezes. Heart:  Normal rate and regular rhythm. Occas dropped beat. S1 and S2 normal without gallop, murmur, click, rub or other extra sounds. Abdomen:  Bowel sounds positive,abdomen soft and non-tender without masses, organomegaly or hernias noted. No suprapubic tenderness. Msk:  No CVAT. Psych:  Cognition and judgment appear intact. Alert and cooperative with normal attention span and concentration. No apparent delusions, illusions, hallucinations  Appended Document: 4  MONTH FU/RBH Pt called to let you know that she is feeling better now, she won't be bringing in a urine sample.

## 2010-07-16 ENCOUNTER — Emergency Department (HOSPITAL_COMMUNITY)
Admission: EM | Admit: 2010-07-16 | Discharge: 2010-07-16 | Disposition: A | Discharge status: Home / Self Care | Payer: Medicare Other | Attending: Emergency Medicine | Admitting: Emergency Medicine

## 2010-07-16 ENCOUNTER — Encounter (HOSPITAL_COMMUNITY): Payer: Self-pay | Admitting: Radiology

## 2010-07-16 ENCOUNTER — Emergency Department (HOSPITAL_COMMUNITY): Payer: Medicare Other

## 2010-07-16 DIAGNOSIS — R51 Headache: Secondary | ICD-10-CM | POA: Insufficient documentation

## 2010-07-16 DIAGNOSIS — E039 Hypothyroidism, unspecified: Secondary | ICD-10-CM | POA: Insufficient documentation

## 2010-07-16 DIAGNOSIS — M542 Cervicalgia: Secondary | ICD-10-CM | POA: Insufficient documentation

## 2010-07-16 DIAGNOSIS — W010XXA Fall on same level from slipping, tripping and stumbling without subsequent striking against object, initial encounter: Secondary | ICD-10-CM | POA: Insufficient documentation

## 2010-07-16 DIAGNOSIS — I10 Essential (primary) hypertension: Secondary | ICD-10-CM | POA: Insufficient documentation

## 2010-07-16 DIAGNOSIS — IMO0002 Reserved for concepts with insufficient information to code with codable children: Secondary | ICD-10-CM | POA: Insufficient documentation

## 2010-07-16 DIAGNOSIS — G9389 Other specified disorders of brain: Secondary | ICD-10-CM | POA: Insufficient documentation

## 2010-07-16 DIAGNOSIS — M25519 Pain in unspecified shoulder: Secondary | ICD-10-CM | POA: Insufficient documentation

## 2010-07-16 LAB — DIFFERENTIAL
Eosinophils Absolute: 0 10*3/uL (ref 0.0–0.7)
Lymphs Abs: 1.1 10*3/uL (ref 0.7–4.0)
Monocytes Absolute: 0.4 10*3/uL (ref 0.1–1.0)
Monocytes Relative: 6 % (ref 3–12)
Neutrophils Relative %: 79 % — ABNORMAL HIGH (ref 43–77)

## 2010-07-16 LAB — POCT I-STAT, CHEM 8
BUN: 15 mg/dL (ref 6–23)
Calcium, Ion: 1.08 mmol/L — ABNORMAL LOW (ref 1.12–1.32)
Chloride: 100 mEq/L (ref 96–112)
Creatinine, Ser: 0.9 mg/dL (ref 0.4–1.2)
Glucose, Bld: 108 mg/dL — ABNORMAL HIGH (ref 70–99)
TCO2: 25 mmol/L (ref 0–100)

## 2010-07-16 LAB — CBC
MCH: 30.4 pg (ref 26.0–34.0)
MCHC: 34.2 g/dL (ref 30.0–36.0)
MCV: 89 fL (ref 78.0–100.0)
Platelets: 196 10*3/uL (ref 150–400)
RBC: 4.37 MIL/uL (ref 3.87–5.11)

## 2010-07-30 ENCOUNTER — Other Ambulatory Visit: Payer: Self-pay | Admitting: Family Medicine

## 2010-08-26 NOTE — Discharge Summary (Signed)
NAME:  JAIME, DOME NO.:  [id number]   MEDICAL RECORD NO.:  [id number]          PATIENT TYPE:  INP   LOCATION:  1602                         FACILITY:  [facility]   PHYSICIAN:  Burnard Bunting, M.D.    DATE OF BIRTH:  04/18/1916   DATE OF ADMISSION:  01/21/2007  DATE OF DISCHARGE:                               DISCHARGE SUMMARY   ADDENDUM   Talasia Klare is an 77 year old female who has done well following her  hemiarthroplasty.  Laboratory values yesterday include a hemoglobin of  8.3, an INR of 1.5.  The incision is intact.  At the time of discharge,  she is going to be discharged to [facility].  She will follow  up with me about a week from discharge.      Burnard Bunting, M.D.  Electronically Signed     GSD/MEDQ  D:  01/25/2007  T:  01/25/2007  Job:  [id number]

## 2010-08-26 NOTE — H&P (Signed)
NAME:  Jenny Burns NO.:  [id number]   MEDICAL RECORD NO.:  [id number]          PATIENT TYPE:  INP   LOCATION:  0108                         FACILITY:  [facility]   PHYSICIAN:  Burnard Bunting, M.D.    DATE OF BIRTH:  [birth year]   DATE OF ADMISSION:  01/21/2007  DATE OF DISCHARGE:                              HISTORY & PHYSICAL   CHIEF COMPLAINT:  Right hip pain.   HISTORY OF PRESENT ILLNESS:  Jenny Burns is an 77 year old ambulatory  female who fell on her porch without loss of consciousness today around  12:00. She reports right hip pain. She denies any other orthopedic  complaints. She denies loss of consciousness. She cannot weight bear on  the right leg. She has had no real problems with the right leg before.  She recently did have kyphoplasty on her back but does not report any  numbness and tingling in the legs. She was carrying a rug at the time.   PAST MEDICAL HISTORY:  OA, osteoporosis, hypertension.   PAST SURGICAL HISTORY:  Right shoulder hemiarthroplasty, history of  kyphoplasty, hysterectomy.   ALLERGIES:  CODEINE, MORPHINE, PERCOCET.   CURRENT MEDICATIONS:  Metoprolol, Norvasc, Synthroid, Evista, iron,  Lisinopril, and vitamin B.   SOCIAL HISTORY:  The patient does not smoke or drink. She lives with her  son at home. She ambulates with a walker but the family states that she  does not use the walker that much.   REVIEW OF SYSTEMS:  14 point review of systems reviewed and are  negative.   PHYSICAL EXAMINATION:  GENERAL:  She is in mild distress.  VITAL SIGNS:  Temperature 98.7. Blood pressure 152/59. Heart rate 84.  Respiratory rate 18. She is 94% saturated on room air.  CHEST:  Clear to auscultation.  HEART:  Regular rate and rhythm.  LUNGS:  Examination benign.  NECK:  Non-tender with full range of motion. There is no  supraclavicular, cervical, or axillary lymphadenopathy bilaterally.  EXTREMITIES:  She has bilateral full range of motion  of the wrists and  elbows. She has decreased range of motion  of the right shoulder hemi-  arthroplasty but no crepitus or grinding. She has full pronation and  supination bilaterally. No paresthesias at C5 and T1. She has a palpable  radial pulse and symmetric reflexes in the upper extremities. Lower  extremity examination demonstrates shortening and external rotation of  the right leg. She has good dorsiflexion and plantar flexion strength in  the feet with intact sensation on dorsi-plantar aspect. She has palpable  pedal pulses. DP 1+ out of 4 bilaterally. There is no knee effusion  bilaterally. No other bruising or ecchymosis in the bilateral lower  extremities. She does have 1 small bruise on the subcutaneous part of  the ulna but no crepitus in this region.   LABORATORY DATA:  EKG with heart rate of around 100. Current rhythm  strip shows sinus rhythm.   Chest x-ray shows no apparent distress. X-ray of the hip shows a  displaced right femoral neck fracture.   UA is negative. White blood  cell count is 10.6. Hemoglobin 10.3.  Platelets 378,000. Sodium and potassium 139 and 3.6. BUN and creatinine  11 and 0.79. Glucose 117.   IMPRESSION:  Right displaced femoral neck fracture.   PLAN:  Right hip hemiarthroplasty. Risks and benefits are discussed with  the patient, which include but are not limited to, infection, malunion,  deep vein thrombosis, leg lengthening, nerve vessel damage, death, and  dislocation. All questions are answered.      Burnard Bunting, M.D.  Electronically Signed     GSD/MEDQ  D:  01/21/2007  T:  01/22/2007  Job:  [id number]

## 2010-08-26 NOTE — Discharge Summary (Signed)
NAME:  Jenny Burns, Jenny Burns NO.:  [id number]   MEDICAL RECORD NO.:  [id number]          PATIENT TYPE:  INP   LOCATION:  1602                         FACILITY:  [facility]   PHYSICIAN:  Burnard Bunting, M.D.    DATE OF BIRTH:  Sep 21, 1916   DATE OF ADMISSION:  01/21/2007  DATE OF DISCHARGE:  01/24/2007                               DISCHARGE SUMMARY   DISCHARGE DIAGNOSIS:  Right hip fracture.   SECONDARY DIAGNOSES:  1. Osteoporosis.  2. Hypertension.  3. History of right shoulder hemiarthroplasty.   OPERATIONS AND NOTABLE PROCEDURES:  Right hip hemiarthroplasty performed  January 21, 2007.   HOSPITAL COURSE:  Jenny Burns is an ambulatory, 77 year old female with  right hip pain.  She underwent right hip hemiarthroplasty on January 19, 2007.  She tolerated the procedure well without immediate complications.  Initial hemoglobin was 10.3.  hematocrit on postoperative day #2 was  24.7.  The patient tolerated her hip replacement well.  She was started  on physical therapy for mobilization and weight-bearing as tolerated as  well as CPM machine for hip range of motion and Coumadin for deep venous  thrombosis prophylaxis.  Her INR was 1.6 at the time of discharge.  The  patient was intact at the time of discharge.  She had an otherwise  unremarkable recovery.  She is discharged to a skilled nursing facility  in good condition.  She will followup with me in 7 days for suture  removal.   DISCHARGE MEDICATIONS:  1. Lopressor 50 mg one p.o. b.i.d.  2. Norvasc 5 mg p.o. daily.  3. Lisinopril 2.5 mg p.o. daily.  4. Synthroid 50 mcg p.o. daily.  5. Evista 60 mg p.o. daily.  6. Ferrous sulfate 325 mg p.o. b.i.d.  7. Folate Calciferol __________ p.o. daily.  8. Vitamin B12 one tab p.o. daily, which is __________.  9. Coumadin 5 mg p.o. daily for INR 2.0 to 2.5.  10.Dilaudid tablets 2 mg, one p.o. q.3-4h. p.r.n. pain.  11.Robaxin 500 mg p.o. q.6h. p.r.n. spasm.      Burnard Bunting, M.D.  Electronically Signed     GSD/MEDQ  D:  01/24/2007  T:  01/24/2007  Job:  [id number]

## 2010-08-26 NOTE — Op Note (Signed)
NAME:  Jenny Burns, Jenny Burns NO.:  [id number]   MEDICAL RECORD NO.:  [id number]          PATIENT TYPE:  INP   LOCATION:  0108                         FACILITY[facility]   PHYSICIAN:  Burnard Bunting, M.D.    DATE OF BIRTH:  1917-01-15   DATE OF PROCEDURE:  01/21/2007  DATE OF DISCHARGE:                               OPERATIVE REPORT   PREOPERATIVE DIAGNOSIS:  Right hip femoral neck fracture, displaced.   POSTOPERATIVE DIAGNOSIS:  Right hip femoral neck fracture, displaced.   PROCEDURE:  Right hip hemiarthroplasty.   SURGEON:  Burnard Bunting, M.D.   ASSISTANT:  Jerolyn Shin. Jenny Burns, M.D.   ANESTHESIA:  General endotracheal anesthesia.   BLOOD LOSS:  100 mL.   DRAINS:  None.   INDICATIONS:  Jenny Burns is an 77 year old ambulatory female who  sustained a right hip fracture.  She presents now for operative  management after explanation of the risks and benefits.   PROCEDURE IN DETAIL:  The patient was brought to the operating room  where general endotracheal anesthesia was induced.  Preoperative  antibiotics were administered.  The patient was placed in the lateral  position with the left axilla and peroneal nerve well padded.  The  operative field was prepped with DuraPrep and draped in a sterile  manner.  Jenny Burns was used to cover the operative field.  A posterior  approach to the hip was utilized.  The skin and subcutaneous tissue were  sharply divided.  The fascia lata was divided and the gluteus maximus  fibers were divided in line with their fibers.  Bleeding points  encountered were controlled with electrocautery.  A Charnley retractor  was placed.  The sciatic nerve was palpated and protected at all times  during the remaining portion of the case.  The piriformis tendon was  tagged and retracted.  The external rotators were detached from the  capsule.  The capsule was then split in a T-shaped fashion and marked  with two #1 Ethibond sutures.  The piriformis tendon  was also marked  with a #1 Ethibond suture.  The femoral head was removed.  A femoral  neck cut was then made at an approximately 45 degrees angle one  fingerbreadth above the lesser trochanter.  Broaching was then  performed.  Trial reduction was performed with the broach in position.  The head was removed and sized to a 44.  -3 and plus 0 head was then  placed with excellent stability achieved in full extension, external  rotation, position of sleep, 90 degrees of hip flexion, 10 degrees  abduction, and 70 degrees of internal rotation.  The trial components  were removed.  The true components were placed and trialed again with  the -3 which gave the same parameters of stability.  The incision was  thoroughly irrigated. After placement of true components, the same  stability parameters were maintained.  The capsule was closed using a #1  Ethibond suture.  The piriformis was tagged to the capsule and the  greater trochanter.  The fascia lata was then closed using #1 Vicryl  interrupted inverted sutures.  The subdermal tissue was closed using  interrupted inverted 0 Vicryl suture, 2-0 Vicryl suture, and skin  staples.  A Mepilex dressing was placed.  The leg  lengths were approximately equal at the conclusion of the case  A well  padded knee immobilizer was placed.  Dr. Lenny Pastel assistance was  required at all times during this case for retraction for neurovascular  structures and assistance with manipulating the leg in this osteoporotic  patient.      Burnard Bunting, M.D.  Electronically Signed     GSD/MEDQ  D:  01/21/2007  T:  01/22/2007  Job:  [id number]

## 2010-08-26 NOTE — Discharge Summary (Signed)
NAME:  Jenny Burns, Jenny Burns                ACCOUNT NO.:  [id number]   MEDICAL RECORD NO.:  [id number]          PATIENT TYPE:  INP   LOCATION:  1613                         FACILITY:  [facility]   PHYSICIAN:  Valerie A. Felicity Coyer, MDDATE OF BIRTH:  05/16/1916   DATE OF ADMISSION:  05/22/2007  DATE OF DISCHARGE:  05/26/2007                               DISCHARGE SUMMARY   DISCHARGE DIAGNOSES:  1. Accidental fall.  2. T11 compression fracture.  3. Right sacral and right inferior pubis ramus fracture status post      fall.  4. Mild anemia.   HISTORY OF PRESENT ILLNESS:  Ms. Jenny Burns is a very pleasant 77 year old  white female with past medical history of hypertension, hypothyroidism  who lives at home with her son, presented to the ED on day of admission  with fall and inability to transfer or ambulate after fall secondary to  pain. The patient stated she was not using her walker at time of fall  and legs slipped out from under her. She denied any headache, dizziness.  No visual changes or speech changes. The patient was admitted at the  hospital at that time for further evaluation and treatment and to rule  out any cardiac or neurologic source of fall.   PAST MEDICAL HISTORY:  1. Thoracic compression fractures, status post kyphoplasty of T spine.  2. Frequent falls.  3. Hypertension.  4. Hypothyroidism.  5. Osteoporosis.  6. Anemia.  7. Bladder tacking.  8. Right shoulder hemiarthroplasty.  9. Right hip hemiarthroplasty status post fracture.   COURSE OF HOSPITALIZATION:  1. Falls. The patient was admitted on the telemetry unit to rule out      any arrhythmias. There were no arrhythmias noted during this      hospitalization. A 2-D echocardiogram was performed which revealed      a normal left ventricular ejection fraction of 65% with mild to      moderate dilatation of the left atrium. Carotid Dopplers were      performed to rule out any stenosis which revealed 60-80% bilateral      ICA  stenosis. X-ray of the T spine done on February 8 revealed a      mild new compression fracture at T11. X-rays of the chest, pelvis,      knees, C spine and L spine were all negative for any acute      findings. MRI of the head and brain were negative for any acute      findings. MRI of the abdomen and pelvis was consistent with      nondisplaced fracture of right sacrum and inferior pubis ramus with      no abnormalities to the right hip. The patient felt medically      stable for transfer to skilled nursing facility for physical      therapy and for weight bearing as tolerated. Both patient and son      are agreeable to skilled nursing facility for rehabilitation. The      patient underwent evaluation by physical therapy and occupational      therapy  who recommended patient undergo intensive rehabilitation at      skilled nursing facility, as opposed to home health physical      therapy.  2. Mild anemia. Patient with no signs of acute blood loss on exam.      Hemoglobin stable at discharge, and this can be followed outpatient      by patient's primary care physician.   MEDICATIONS AT DISCHARGE:  1. Metoprolol 25 mg p.o. b.i.d.  2. Norvasc 5 mg p.o. daily.  3. Synthroid 50 mcg p.o. daily.  4. Lisinopril 20 mg p.o. daily.  5. Milk of magnesium 30 mL suspension as needed for constipation.   PHYSICAL EXAMINATION:  At time of discharge, blood pressure 151/57,  heart rate 56, respirations 18, temperature 97.2, oxygen saturation 94%  on room air.  In general, Jenny Burns is an elderly, very pleasant, white female in no  acute distress.  HEENT:  Pupils are equal, round, reactive to light. No scleral injection  or icterus. Mucous membranes are moist.  NECK:  Is supple without any thyromegaly or lymphadenopathy. No JVD or  carotid bruit.  CHEST:  Is with symmetrical movement. Clear to auscultation bilaterally  with no wheezes, rales, or rhonchi.  CARDIOVASCULAR:  S1, S2. Regular rate and  rhythm.  ABDOMEN:  Is soft, nontender, nondistended. Bowel sounds positive.  EXTREMITIES:  With no swelling or edema bilaterally to lower  extremities, 2+ dorsalis pedis pulses bilaterally.  NEUROLOGICAL:  The patient is alert and oriented x3 with no focal motor  sensory deficits on exam.  SKIN:  No suspicious rashes or lesions.   LABORATORY WORK:  At time of discharge, hemoglobin 11.4, hematocrit  32.8, platelets 190. Sodium 138, potassium 4, BUN 16, creatinine 0.79.  UA is negative for any infection. Sed rate is 9. TSH 4.233. Anemia panel  revealed iron of 18, total iron binding capacity of 235, B12 564,  ferritin 628. RPR is nonreactive.   DISPOSITION:  The patient is to be transferred to skilled nursing  facility upon bed availability. She is to follow up as needed with Dr.  Hetty Ely at [facility].      Cordelia Pen, NP      Raenette Rover. Felicity Coyer, MD  Electronically Signed    LE/MEDQ  D:  05/26/2007  T:  05/26/2007  Job:  [id number]   cc:   Arta Silence, MD  Fax: [phone]

## 2010-08-29 NOTE — Discharge Summary (Signed)
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   PROCEDURE:  Total abdominal hysterectomy, bilateral salpingo-oophorectomy,  Burch colposuspension, cystoscopy, with abdominal sacropexy including mesh  placement. Surgeon:  Blima Rich, M.D. Assistant:  None.   FINAL DIAGNOSES:  Same.   HOSPITAL COURSE:  This is an 77 year old Caucasian female who underwent the  aforementioned procedures on December 05, 2004 because of total procidentia  and genuine urinary stress incontinence. Of note is the patient has a  capacious bladder with low pressures and slow uroflow. The patient underwent  a loose Burch colposuspension to avoid postoperative urinary retention  and/or urgency or significant residual urine volumes.   Her postoperative course was uneventful. Hemoglobin 10.8, hematocrit 32.2.  Her lungs were clear, incision was dry, she ambulated, calves were without  tenderness, and lungs were clear. The patient was able to tolerate her diet  satisfactorily. She was discharged with Extra-Strength Tylenol one to two  every 6 hours as needed. She will return on December 12, 2004 to have her  staples removed. The patient was advised to contact the office for  temperature elevation above 100.4 degrees Fahrenheit; incisional pain,  drainage, erythema; vaginal bleeding; urinary symptomatology; and/or  constipation. All questions answered to the satisfaction of said patient and  all instructions understood.      Ginger Carne, MD  Electronically Signed     SHB/MEDQ  D:  12/08/2004  T:  12/08/2004  Job:  [id number]

## 2010-08-29 NOTE — H&P (Signed)
NAME:  Jenny Burns, Jenny Burns NO.:  [id number]   MEDICAL RECORD NO.:  [id number]          PATIENT TYPE:  INP   LOCATION:  NA                            FACILITY:  [facility]   PHYSICIAN:  Ginger Carne, MD  DATE OF BIRTH:  [birth year]   DATE OF ADMISSION:  12/05/2004  DATE OF DISCHARGE:                                HISTORY & PHYSICAL   REASON FOR HOSPITALIZATION:  Complete uterovaginal procidentia and Genuine  Urinary Stress Incontinence   HISTORY OF PRESENT ILLNESS:  This is an 77 year old Caucasian female,  gravida 9, para 9-0-0-9, admitted for complete procidentia.  The patient had  complaints of discomfort with ambulation, rest and exercise.  The patient  also complains of irritative symptoms as well.  She loses urine with  coughing, straining and other Valsalva maneuvers.  She is an active  octogenarian who enjoys walking, shopping and activities with her daughters.  The patient is no sedentary.  The patient denies any comorbid diseases  contributing to her incontinence.   The patient states that loss of urine has been gradual over the past one to  two years.  This worsened with her procidentia.  She takes no medications to  enhance her loss of urine and has never had any urological or vaginal  surgery.  The patient has minimal complaints of postvoid dribbling but does  have urgency.  She does not have to strain to void.  She has no fecal  incontinence.   OB/GYN HISTORY:  The patient has had nine full-term vaginal deliveries  between 1935 and 1952.   ALLERGIES:  MORPHINE, CODEINE and SULFA.   MEDICATIONS:  1.  Lotrel 10 mg daily.  2.  Levoxyl 50 mcg daily.  3.  Evista one a day.  4.  Propranolol 40 mg a night and 20 mg in the morning.  5.  K-Dur 20 mg daily.   MEDICAL HISTORY:  Hypertension and hypothyroidism.   SURGICAL HISTORY:  The patient had a right shoulder replaced in 2005 and a  benign tumor removed from the right side of her neck in the past.   SOCIAL HISTORY:  The patient is a nonsmoker.   FAMILY HISTORY:  Noncontributory.   REVIEW OF SYSTEMS:  Negative.   PHYSICAL EXAMINATION:  VITAL SIGNS:  Blood pressure is 150/70, weight 121  pounds, height 4 feet 10 inches.  HEENT:  Grossly normal.  BREASTS:  Without masses, discharge, thickenings or tenderness.  CHEST:  Lungs clear to percussion and auscultation.  CARDIOVASCULAR:  Without murmurs or enlargements, regular rate and rhythm.  EXTREMITIES, LYMPHATIC, SKIN, NEUROLOGIC, MUSCULOSKELETAL:  Within normal  limits.  ABDOMEN:  Soft without gross hepatosplenomegaly.  PELVIC:  The patient demonstrates complete procidentia with irritation of  the external vaginal protrusion.  The mass is easily reduced, and on  Valsalva loses urine.  Filling cystometry demonstrates no evidence for an  overactive bladder.  Residual urine volume is 30 mL.  RECTAL:  Hemoccult-negative without masses.  There is not an appreciable  rectocele or cystocele noted.   The patient was evaluated completely by the [facility] for any cardiovascular elements that could contribute to her risk for  surgery.  The patient was cleared and was considered low-risk.  Cardiolite  study put her in the low-risk category.   IMPRESSION AND PLAN:  The patient has complete uterovaginal procidentia,  will be scheduled for a total abdominal hysterectomy, bilateral salpingo-  oophorectomy, abdominal sacropexy, Burch colposuspension with cystoscopy.  The full nature of the procedure discussed in detail with the patient and  her daughter, risks including injuries to ureter, bowel and bladder;  postoperative ileus; respiratory complications postoperatively in addition  to other unforeseen cardiovascular problems were discussed with the family.  Mesh erosion, infection or rejection was also discussed with the family in  addition to urinary retention, postoperative urgency, recurrent urinary  stress  incontinence, vaginal bleeding and/or recurrent partial or complete  prolapse were discussed as well.  All questions answered to the satisfaction  of patient and family members and all instructions understood. The patient  also understands that she may have a component of intrinsic sphincter  deficiency with a high capacity - low detrussor pressure  profile making a Burch Colposuspension risky insofar as permament urinary  retention. Therefore, Burch repair will be purposefully made loose with  resulting possibility of post-op recurrent urinary incontinence.      Ginger Carne, MD  Electronically Signed     SHB/MEDQ  D:  12/04/2004  T:  12/04/2004  Job:  [id number]

## 2010-08-29 NOTE — Op Note (Signed)
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   PROCEDURE:  1.  Total abdominal hysterectomy.  2.  Bilateral salpingo-oophorectomy.  3.  Burch colposuspension with cystoscopy.  4.  Abdominal sacropexy with mesh.   SURGEON:  Dr. Mia Creek   ASSISTANT:  None.   COMPLICATIONS:  None immediate.   ESTIMATED BLOOD LOSS:  100 mL.   SPECIMEN:  Uterus, cervix, right and left tubes and ovaries.   ANESTHESIA:  General.   OPERATIVE FINDINGS:  The patient demonstrated total procidentia.  The uterus  was normal in size with atrophic ovaries consistent with an octogenarian.  Large and small bowel were grossly normal.  There were areas of adhesive  disease from previous vertical incision located about 3 cm above and at the  level of the umbilicus.  This was not an umbilical hernia and had primarily  omentum affixed to it.  It was deemed appropriate to leave said adhesions  present as these were broad and wide and would not involve loops of bowel.   OPERATIVE PROCEDURE:  The patient prepped and draped in the usual fashion  and placed in lithotomy position.  Betadine solution used for antiseptic,  and the patient was catheterized prior to the procedure.  After adequate  general anesthesia, a Pfannenstiel incision was made and the abdomen opened.  Inspection of the pelvic and abdominal contents carried out.  Ureters were  identified bilaterally.  The round ligaments were clamped, cut, and ligated  with 0 Vicryl suture.   Afterwards, the anterior and posterior peritoneal  reflections were identified, the anterior reflection opened.  After  identifying the ureters bilaterally, the right and left infundibulopelvic  ligaments were clamped, cut, and ligated with 0 Vicryl suture.  Uterine  vasculature clamped, cut, and ligated with 0 Vicryl suture including the  ascending branches at the point of the cervix and the vagina.  The cervix  and uterus were removed and the cuff closed with 0 Vicryl running  interlocking suture.  Copious irrigation with lactated Ringer's followed and  the abdominal sacropexy portion of the procedure was conducted.   The visceral peritoneum overlying L5, S1, and S2 was opened.  The anterior  vertebral ligament was then identified.  Ureters bilaterally and great  vessels were also identified.  The middle sacral vessels also identified,  and 0 Prolene sutures were placed on either side of midline and well-  anchored.  The synthetic mesh was affixed to the posterior aspect of the  vaginal cuff with 0 Prolene suture, two sutures on each side and after  appropriate placement of said sutures, the mesh was  then tensioned  appropriately to the sacral promontory Prolene sutures.  Afterwards, the  anterior flap of the mesh was affixed to the anterior vaginal epithelium.  A  plastic probe was placed in the vagina at all times to assure appropriate  positioning.  Then two 0 Prolene sutures were placed anteriorly on either  side.  Afterwards, appropriate trimming of mesh was carried out.  The mesh  was put together in two separate layers for anterior-posterior placement.  Copious irrigation followed, and the mesh was completely reperitonealized.  The bowel was then inspected.  No evidence for possible obstruction noted.  The peritoneum because of previous surgery could not be appropriately  brought together; however, the Burch colposuspension followed.   With two fingers in the vagina, the space of  Retzius was opened and entered.  Two 0 Prolene sutures were placed on either side of the urethra for a total  of four sutures, starting 1 cm lateral to the urethra on either side and  then expanding out to 2 cm for the lateral sutures.  These were placed on  the lacunar ligament.  Following this, the patient then underwent copious  irrigation with lactated Ringer's.  Afterwards, the fascia was closed with 0  PDS running suture and the skin with skin staples.  The bladder was  inspected with the cystoscope.  No violation of suture noted and no lesions  identified.  At this point, the patient tolerated the procedure well and  returned to the postanesthesia recovery room in excellent condition.      Ginger Carne, MD  Electronically Signed     SHB/MEDQ  D:  12/05/2004  T:  12/05/2004  Job:  [id number]

## 2010-09-02 ENCOUNTER — Ambulatory Visit (INDEPENDENT_AMBULATORY_CARE_PROVIDER_SITE_OTHER): Payer: Medicare Other | Admitting: Family Medicine

## 2010-09-02 ENCOUNTER — Encounter: Payer: Self-pay | Admitting: Family Medicine

## 2010-09-02 VITALS — BP 124/80 | HR 120 | Temp 97.8°F | Wt 103.0 lb

## 2010-09-02 DIAGNOSIS — I4891 Unspecified atrial fibrillation: Secondary | ICD-10-CM

## 2010-09-02 DIAGNOSIS — I10 Essential (primary) hypertension: Secondary | ICD-10-CM

## 2010-09-02 LAB — COMPREHENSIVE METABOLIC PANEL
Alkaline Phosphatase: 69 U/L (ref 39–117)
BUN: 15 mg/dL (ref 6–23)
Glucose, Bld: 126 mg/dL — ABNORMAL HIGH (ref 70–99)
Total Bilirubin: 0.6 mg/dL (ref 0.3–1.2)

## 2010-09-02 LAB — CBC WITH DIFFERENTIAL/PLATELET
Basophils Relative: 0.6 % (ref 0.0–3.0)
Eosinophils Relative: 1.4 % (ref 0.0–5.0)
HCT: 37.5 % (ref 36.0–46.0)
Lymphs Abs: 1.4 10*3/uL (ref 0.7–4.0)
MCHC: 34.3 g/dL (ref 30.0–36.0)
MCV: 94.5 fl (ref 78.0–100.0)
Monocytes Absolute: 0.5 10*3/uL (ref 0.1–1.0)
Platelets: 218 10*3/uL (ref 150.0–400.0)
RBC: 3.97 Mil/uL (ref 3.87–5.11)
WBC: 7.3 10*3/uL (ref 4.5–10.5)

## 2010-09-02 LAB — LIPID PANEL
Cholesterol: 151 mg/dL (ref 0–200)
HDL: 55.5 mg/dL (ref >39.00)
LDL Cholesterol: 82 mg/dL (ref 0–99)
Triglycerides: 67 mg/dL (ref 0.0–149.0)
VLDL: 13.4 mg/dL (ref 0.0–40.0)

## 2010-09-02 MED ORDER — METOPROLOL TARTRATE 50 MG PO TABS: 75.0000 mg | ORAL_TABLET | Freq: Two times a day (BID) | ORAL | Status: DC

## 2010-09-02 NOTE — Progress Notes (Signed)
Leg edema.  Bilateral.  Noted over last few days.  Some better today.  No CP/SOB/palpitations known.  Feeling of pins/needles in R leg.  This occurs when the edema is worse.  Both the pain and the edema are better with leg elevation, worse when she is upright.    PMH and SH reviewed  ROS: See HPI, otherwise noncontributory.  Meds, vitals, and allergies reviewed.   Nad, pleasant, in wheelchair ncat Mmm IRR, tachy Ctab, no inc in wob Ext w/o bands or chords, minimal B ankle edema, no erythema.  No sig calf asymmetry.

## 2010-09-02 NOTE — Patient Instructions (Signed)
We'll be in touch about you seeing the cardiology clinic.   I want you to stop taking the amlodipine and take 1.5 tabs of the metoprolol twice a day.

## 2010-09-03 ENCOUNTER — Encounter: Payer: Self-pay | Admitting: Family Medicine

## 2010-09-03 NOTE — Assessment & Plan Note (Addendum)
I called cards and they'll see patient soon.  I appreciate cards help.  Will inc BB, hold CCB to keep BP from going to low, to work on rate.  Nontoxic and okay for outpatient fu.  If CP or SOB, then to ER.  She and son understands.  The edema may have been from the AF.  The pain may have been from local stretching of the skin.  I would observe both for now. Since she has short fu with cards, I didn't start antiplatelet tx.  See notes on labs.

## 2010-09-04 ENCOUNTER — Ambulatory Visit (INDEPENDENT_AMBULATORY_CARE_PROVIDER_SITE_OTHER): Payer: Medicare Other | Admitting: Cardiovascular Disease

## 2010-09-04 ENCOUNTER — Encounter: Payer: Self-pay | Admitting: Cardiovascular Disease

## 2010-09-04 ENCOUNTER — Other Ambulatory Visit: Payer: Self-pay | Admitting: Family Medicine

## 2010-09-04 DIAGNOSIS — R209 Unspecified disturbances of skin sensation: Secondary | ICD-10-CM

## 2010-09-04 DIAGNOSIS — R609 Edema, unspecified: Secondary | ICD-10-CM

## 2010-09-04 DIAGNOSIS — I4891 Unspecified atrial fibrillation: Secondary | ICD-10-CM

## 2010-09-04 DIAGNOSIS — I10 Essential (primary) hypertension: Secondary | ICD-10-CM

## 2010-09-04 DIAGNOSIS — R238 Other skin changes: Secondary | ICD-10-CM

## 2010-09-04 DIAGNOSIS — I739 Peripheral vascular disease, unspecified: Secondary | ICD-10-CM

## 2010-09-04 MED ORDER — METOPROLOL TARTRATE 100 MG PO TABS: 100.0000 mg | ORAL_TABLET | Freq: Two times a day (BID) | ORAL | Status: DC

## 2010-09-04 MED ORDER — ASPIRIN 81 MG PO TABS: 81.0000 mg | ORAL_TABLET | Freq: Every day | ORAL | Status: DC

## 2010-09-04 MED ORDER — DIGOXIN 125 MCG PO TABS: 125.0000 ug | ORAL_TABLET | Freq: Every day | ORAL | Status: DC

## 2010-09-04 MED ORDER — FUROSEMIDE 20 MG PO TABS: 20.0000 mg | ORAL_TABLET | Freq: Every day | ORAL | Status: DC | PRN

## 2010-09-04 NOTE — Assessment & Plan Note (Signed)
New atrial fibrillation since April as previous EKG showed normal sinus rhythm. She seems to have worsening lower extremity edema and possible effusions at the bases bilaterally. She is reluctant to take Lasix given history of bladder surgery and incontinence. We have suggested she take Lasix p.r.n. For worsening shortness of breath. For better rate control, we will increase her metoprolol tartrate to 100 mg b.i.d. And start digoxin 0.125 mg daily. We have ordered an echocardiogram to evaluate her right ventricular pressures to help guide diuresis. We'll start aspirin 81 mg daily as she is not a good Coumadin candidate.

## 2010-09-04 NOTE — Progress Notes (Signed)
   Patient ID: Jenny Burns, female    DOB: January 20, 1917, 77 y.o.   MRN: [id number]  HPI Comments: 77 year old woman with a history of frequent falls, traumatic injury to her shoulder requiring surgery, also history of bladder and hysterectomy surgery in 2006 with long hospital course who presents after EKG showed atrial fibrillation earlier this month with Dr. Para March.  She reports that she has had increased leg swelling, as well as some cramps, cold feet and leg weakness. She does have very mild shortness of breath. She denies any tachycardia palpitations. Her son takes care of her and reports that her gait is poor and she does have frequent falls. He is concerned about circulation to her feet as they sometimes look purple. She does complain quite frequently of her legs and cold feet. She tries to stay independent but does need significant assistance.  She has a history of back surgery after breaking her back in 2007.  She refused EKG today as she reported that one was done recently     Review of Systems  Constitutional: Positive for fatigue.  HENT: Negative.   Eyes: Negative.   Respiratory: Negative.   Cardiovascular: Positive for leg swelling.  Gastrointestinal: Negative.   Musculoskeletal: Positive for myalgias, back pain, joint swelling, arthralgias and gait problem.  Skin: Negative.   Neurological: Positive for weakness.  Hematological: Negative.   Psychiatric/Behavioral: Negative.     BP 132/78  Pulse 88  Ht 4\' 10"  (1.473 m)  Wt 102 lb (46.267 kg)  BMI 21.32 kg/m2   Physical Exam  Nursing note and vitals reviewed. Constitutional: She is oriented to person, place, and time. She appears well-developed and well-nourished.  HENT:  Head: Normocephalic.  Nose: Nose normal.  Mouth/Throat: Oropharynx is clear and moist.  Eyes: Conjunctivae are normal. Pupils are equal, round, and reactive to light.  Neck: Normal range of motion. Neck supple. No JVD present.  Cardiovascular: S1  normal, S2 normal and intact distal pulses.  An irregularly irregular rhythm present. Tachycardia present.  Exam reveals no gallop and no friction rub.   Murmur heard.  Crescendo systolic murmur is present with a grade of 2/6       1+ edema in the lower extremity most notable on the left  Pulmonary/Chest: Effort normal and breath sounds normal. No respiratory distress. She has no wheezes. She has no rales. She exhibits no tenderness.  Abdominal: Soft. Bowel sounds are normal. She exhibits no distension. There is no tenderness.  Musculoskeletal: Normal range of motion. She exhibits edema. She exhibits no tenderness.  Lymphadenopathy:    She has no cervical adenopathy.  Neurological: She is alert and oriented to person, place, and time. Coordination normal.  Skin: Skin is warm and dry. No rash noted. No erythema.  Psychiatric: She has a normal mood and affect. Her behavior is normal. Judgment and thought content normal.         Assessment and Plan

## 2010-09-04 NOTE — Assessment & Plan Note (Signed)
It is uncertain if her lower extremity edema is from fluid overload. Echocardiogram will help determine right heart pressures and will guide diuresis. We will work on improved rate control and Lasix as needed for worsening edema for now.

## 2010-09-04 NOTE — Assessment & Plan Note (Signed)
We'll continue her current medications though increase the metoprolol tartrate to 100 mg b.i.d. Have asked her son to monitor her blood pressure

## 2010-09-04 NOTE — Assessment & Plan Note (Signed)
She does report having cold feet, sometimes that are discolored. They are painful, she has muscle cramping. We have ordered lower extremity ultrasound to exclude peripheral vascular disease.

## 2010-09-04 NOTE — Patient Instructions (Addendum)
Please increase the metoprolol to 100 mg twice a day (Am and PM) Please start digoxin 0.125 mg daily. Please take lasix 20 mg as needed for leg swelling.  Your physician has requested that you have an echocardiogram. Echocardiography is a painless test that uses sound waves to create images of your heart. It provides your doctor with information about the size and shape of your heart and how well your heart's chambers and valves are working. This procedure takes approximately one hour. There are no restrictions for this procedure.  Your physician has requested that you have a lower extremity arterial exercise duplex. During this test, exercise and ultrasound are used to evaluate arterial blood flow in the legs. Allow one hour for this exam. There are no restrictions or special instructions.  Your physician recommends that you schedule a follow-up appointment in: 1-2 weeks after studies

## 2010-09-09 ENCOUNTER — Telehealth: Payer: Self-pay | Admitting: *Deleted

## 2010-09-09 NOTE — Telephone Encounter (Signed)
Pt called this am stating that she was seen last Thur by Dr. Mariah Milling and discussed her leg weakness, she states that there has not been any change but just wanted to know if there is anything that could be done in the meantime for this? Pt has LE u/s scheduled for 6/5, notified pt it is hard to say what would help until we know the results of this test. Pt ok with this. Will forward to Dr. Mariah Milling.

## 2010-09-10 NOTE — Telephone Encounter (Signed)
Noted  

## 2010-09-10 NOTE — Telephone Encounter (Signed)
Leg weakness could be coming from circulation, or edema. She was is atrial fibrillation, this is likely causing swelling. Has she tried lasix for the edema. This might help with swelling. Need results of echo and leg ultrasound first

## 2010-09-16 ENCOUNTER — Encounter (INDEPENDENT_AMBULATORY_CARE_PROVIDER_SITE_OTHER): Payer: Medicare Other | Admitting: *Deleted

## 2010-09-16 ENCOUNTER — Other Ambulatory Visit (INDEPENDENT_AMBULATORY_CARE_PROVIDER_SITE_OTHER): Payer: Medicare Other | Admitting: *Deleted

## 2010-09-16 DIAGNOSIS — I4891 Unspecified atrial fibrillation: Secondary | ICD-10-CM

## 2010-09-16 DIAGNOSIS — R609 Edema, unspecified: Secondary | ICD-10-CM

## 2010-09-16 DIAGNOSIS — R011 Cardiac murmur, unspecified: Secondary | ICD-10-CM

## 2010-09-16 DIAGNOSIS — I739 Peripheral vascular disease, unspecified: Secondary | ICD-10-CM

## 2010-09-18 ENCOUNTER — Encounter: Payer: Self-pay | Admitting: Cardiovascular Disease

## 2010-09-19 ENCOUNTER — Telehealth: Payer: Self-pay

## 2010-09-19 ENCOUNTER — Encounter: Payer: Self-pay | Admitting: Cardiovascular Disease

## 2010-09-19 NOTE — Telephone Encounter (Signed)
Please call regarding test results that were done on Tues. September 16, 2010.  She is not feeling any better and need to know what to do?

## 2010-09-19 NOTE — Telephone Encounter (Signed)
Reviewed tests with Dr. Mariah Milling, and spoke to pt letting her know that there is a lot to go over at her appt 09/23/10. Notified pt that Dr. Mariah Milling will discuss in detail results of tests and plan of care at that time. Pt ok with this.

## 2010-09-23 ENCOUNTER — Encounter: Payer: Self-pay | Admitting: Cardiovascular Disease

## 2010-09-23 ENCOUNTER — Ambulatory Visit (INDEPENDENT_AMBULATORY_CARE_PROVIDER_SITE_OTHER): Payer: Medicare Other | Admitting: Cardiovascular Disease

## 2010-09-23 DIAGNOSIS — R238 Other skin changes: Secondary | ICD-10-CM

## 2010-09-23 DIAGNOSIS — I4891 Unspecified atrial fibrillation: Secondary | ICD-10-CM

## 2010-09-23 DIAGNOSIS — I739 Peripheral vascular disease, unspecified: Secondary | ICD-10-CM

## 2010-09-23 DIAGNOSIS — E785 Hyperlipidemia, unspecified: Secondary | ICD-10-CM

## 2010-09-23 DIAGNOSIS — R209 Unspecified disturbances of skin sensation: Secondary | ICD-10-CM

## 2010-09-23 DIAGNOSIS — I10 Essential (primary) hypertension: Secondary | ICD-10-CM

## 2010-09-23 MED ORDER — ISOSORBIDE MONONITRATE ER 30 MG PO TB24: 30.0000 mg | ORAL_TABLET | Freq: Every day | ORAL | Status: DC

## 2010-09-23 MED ORDER — ATORVASTATIN CALCIUM 10 MG PO TABS: 10.0000 mg | ORAL_TABLET | Freq: Every day | ORAL | Status: DC

## 2010-09-23 NOTE — Progress Notes (Signed)
Patient ID: Jenny Burns, female    DOB: [birth year], 77 y.o.   MRN: [id number]  HPI Comments: 77 year old woman with a history of frequent falls, traumatic injury to her shoulder requiring surgery, also history of bladder and hysterectomy surgery in 2006 with long hospital course, recent onset of atrial fibrillation Not on anticoagulation as she is a high fall risk candidate, hypertension who presents for routine followup. On her last visit, she had significant edema.  On her last visit, we increased her metoprolol and started digoxin for rate control of her atrial fibrillation. Her son provides numbers forced today. Her pulse rate has been typically in the 60s to 70s. Blood pressure has been elevated, typically in the 150-170 range, sometimes higher in the 180s. Her edema has resolved. Overall she feels well. She has not had to take Lasix apart from one time.  She had lower extremity ABIs that showed significant disease bilaterally. Her feet are typically cold, weak, with purple discoloration.  She tries to stay independent but does need significant assistance.  She has a history of back surgery after breaking her back in 2007.  EKG shows atrial fibrillation with rate of 64 beats per minute, no other significant ST or T wave changes      Review of Systems  HENT: Negative.   Eyes: Negative.   Respiratory: Negative.   Cardiovascular: Negative.        Cold discolored feet bilaterally  Gastrointestinal: Negative.   Musculoskeletal: Positive for arthralgias and gait problem.  Skin: Negative.   Neurological: Positive for weakness.  Hematological: Negative.   Psychiatric/Behavioral: Negative.   All other systems reviewed and are negative.    BP 185/74  Pulse 66  Ht 4\' 10"  (1.473 m)  Wt 93 lb (42.185 kg)  BMI 19.44 kg/m2   Physical Exam  Nursing note and vitals reviewed. Constitutional: She is oriented to person, place, and time. She appears well-developed and well-nourished.      Sitting in a wheelchair, unable to ambulate easily  HENT:  Head: Normocephalic.  Nose: Nose normal.  Mouth/Throat: Oropharynx is clear and moist.  Eyes: Conjunctivae are normal. Pupils are equal, round, and reactive to light.  Neck: Normal range of motion. Neck supple. No JVD present.  Cardiovascular: Normal rate, S1 normal, S2 normal and intact distal pulses.  An irregularly irregular rhythm present. Exam reveals decreased pulses. Exam reveals no gallop and no friction rub.   Murmur heard.  Crescendo systolic murmur is present with a grade of 1/6  Pulses:      Carotid pulses are 2+ on the right side, and 2+ on the left side.      Radial pulses are 2+ on the right side, and 2+ on the left side.       Dorsalis pedis pulses are 0 on the right side, and 0 on the left side.       Posterior tibial pulses are 0 on the right side, and 0 on the left side.  Pulmonary/Chest: Effort normal and breath sounds normal. No respiratory distress. She has no wheezes. She has no rales. She exhibits no tenderness.  Abdominal: Soft. Bowel sounds are normal. She exhibits no distension. There is no tenderness.  Musculoskeletal: Normal range of motion. She exhibits no edema and no tenderness.  Lymphadenopathy:    She has no cervical adenopathy.  Neurological: She is alert and oriented to person, place, and time. Coordination normal.  Skin: Skin is warm and dry. No rash noted. No erythema.  Psychiatric: She has a normal mood and affect. Her behavior is normal. Judgment and thought content normal.         Assessment and Plan

## 2010-09-23 NOTE — Assessment & Plan Note (Addendum)
Her blood pressure is significantly elevated. In an effort to control her blood pressure and possibly vasodilator given her severe arterial disease of the legs based on peripheral vascular ultrasound, we will start isosorbide mononitrate 30 mg daily. If she has side effects such as headache, we will have to try an alternate medication. We have asked her son to monitor her blood pressure. If she has side effects, we could try hydralazine t.i.d..

## 2010-09-23 NOTE — Assessment & Plan Note (Signed)
Given her significant disease Of the lower extremities and likely occlusion of her bilateral SFA, we will start her on Lipitor 10 mg daily

## 2010-09-23 NOTE — Assessment & Plan Note (Addendum)
Lower extremity ultrasound suggests multilevel arterial occlusive disease, inflow disease, likely bilateral SFA occlusions and tibial disease. ABI on the right was 0.48, ABI left 0.61. *The various treatment options, we will start with medical management. If she has worsening symptoms, we may have to be more aggressive with CT scan and possible intervention.

## 2010-09-23 NOTE — Patient Instructions (Signed)
You are doing well. Please start lipitor daily Start  Isosorbide daily. Monitor blood pressure. Please call us if you have new issues that need to be addressed before your next appt.  We will call you for a follow up Appt. In 3 months

## 2010-09-23 NOTE — Assessment & Plan Note (Signed)
Her heart rate is significantly improved. We will continue metoprolol and digoxin. If asked her son to monitor her heart rate and blood pressure.

## 2010-09-25 ENCOUNTER — Telehealth: Payer: Self-pay | Admitting: *Deleted

## 2010-09-25 NOTE — Telephone Encounter (Signed)
Please see if home health eval can be done, h/o falls, ongoing AF with mult medications that may need adjustment (ie BP meds).

## 2010-09-25 NOTE — Telephone Encounter (Signed)
Son is asking if he can get home health to start coming out a couple of times a week to check patient out. He is concerned with her recent heart issues. Please advise.

## 2010-09-26 ENCOUNTER — Telehealth: Payer: Self-pay | Admitting: *Deleted

## 2010-09-26 NOTE — Telephone Encounter (Signed)
Pt's son calling stating that pt's HR is 49, and it has been <60 for past 3 days, and pt c/o weakness. He has held her Digoxin as instructed by Dr. Mariah Milling at last ov, and HR has not improved. Advised to have pt hold her Metoprolol 100mg  bid until HR >60, then when it is normal cut dose in 1/2 for (50mg  bid). Monitor HR over weekend and call office Monday with update of results and how pt feels.

## 2010-09-29 ENCOUNTER — Telehealth: Payer: Self-pay | Admitting: *Deleted

## 2010-09-29 NOTE — Telephone Encounter (Signed)
Referral picked up by [facility].  Start of care date 09/30/2010. They will call the son.

## 2010-09-29 NOTE — Telephone Encounter (Signed)
Spoke to pt's son this AM, he called to f/u with pt's HR after we had her hold Digoxin and Metoprolol on Friday for HR of 49 and feeling weak. He states her HR has improved and has incr daily from 62, 73 and today 75. BP was 160-170/70s over the weekend and today 144/65. (Pt restarted Metoprolol Tartrate at decr dose 50mg  bid yesterday). He states her weakness has improved some as well, but is normally weak due to BLE arterial occlusive disease. Advised that pt restart Digoxin as well, and monitor HR and call me back tomorrow for update. Per Dr. Mariah Milling previously, we may need to decr metoprolol dose again if HR low again. Pt's son ok with this and will call tomorrow afternoon.

## 2010-09-30 ENCOUNTER — Encounter: Payer: Self-pay | Admitting: Family Medicine

## 2010-09-30 ENCOUNTER — Other Ambulatory Visit: Payer: Self-pay | Admitting: Family Medicine

## 2010-10-01 ENCOUNTER — Ambulatory Visit (INDEPENDENT_AMBULATORY_CARE_PROVIDER_SITE_OTHER): Payer: Medicare Other | Admitting: Family Medicine

## 2010-10-01 ENCOUNTER — Encounter: Payer: Self-pay | Admitting: Family Medicine

## 2010-10-01 ENCOUNTER — Ambulatory Visit: Payer: Medicare Other | Admitting: Family Medicine

## 2010-10-01 DIAGNOSIS — R300 Dysuria: Secondary | ICD-10-CM | POA: Insufficient documentation

## 2010-10-01 DIAGNOSIS — R350 Frequency of micturition: Secondary | ICD-10-CM

## 2010-10-01 LAB — POCT URINALYSIS DIPSTICK
Nitrite, UA: POSITIVE
Protein, UA: NEGATIVE
Urobilinogen, UA: 0.2
pH, UA: 7

## 2010-10-01 MED ORDER — CIPROFLOXACIN HCL 500 MG PO TABS: 500.0000 mg | ORAL_TABLET | Freq: Two times a day (BID) | ORAL | Status: AC

## 2010-10-01 NOTE — Progress Notes (Signed)
  Subjective:    Patient ID: Jenny Burns, female    DOB: Feb 22, 1917, 77 y.o.   MRN: [id number]  HPI CC: ?UTI  Jenny Burns is pleasant 77yo new to me who presents with son Jenny Burns) and daughter Jenny Burns)  Hasn't been feeling well for last few weeks.  Has been having dysuria.  None today.  No frequency.  + urgency.  No abd pain, flank pain, f/c/v.  Noticing gets tired more easily.  Some nausea.  H/o UTIs in past.  H/o bladder tack and hysterectomy 2006, wears pads regularly.  Having trouble with walking 2/2 blockages.  Review of Systems Per HPI    Objective:   Physical Exam  Nursing note and vitals reviewed. Constitutional: No distress.       frail elderly in wheelchair  Eyes: Pupils are equal, round, and reactive to light.  Neck: Normal range of motion. Neck supple.  Cardiovascular: Normal rate, regular rhythm, normal heart sounds and intact distal pulses.   No murmur heard. Pulmonary/Chest: Effort normal and breath sounds normal. No respiratory distress. She has no wheezes. She has no rales.  Abdominal: Soft. Bowel sounds are normal. She exhibits no distension. There is no tenderness. There is no CVA tenderness.       No suprapubic tenderness  Musculoskeletal: She exhibits no edema.  Skin: Skin is warm and dry. No rash noted.          Assessment & Plan:

## 2010-10-01 NOTE — Assessment & Plan Note (Signed)
Dysuria, UA suspicious for infection.  Sent UCx. Treat with short course cipro. Bactrim allergy. H/o recurrent UTIs vs asxs bacteriuria.  Will treat today as having sxs. Update if not improved. Advise push fluids as pt and family state she is not getting enough fluids currently.

## 2010-10-01 NOTE — Patient Instructions (Signed)
Looks like infection. Treat with cipro twice daily for 3 days Push fluids and rest (water and cranberry juice). Update [place] if not feeling better after antibiotics.

## 2010-10-02 ENCOUNTER — Ambulatory Visit: Payer: Medicare Other | Admitting: Family Medicine

## 2010-10-04 LAB — URINE CULTURE

## 2010-10-06 ENCOUNTER — Telehealth: Payer: Self-pay | Admitting: *Deleted

## 2010-10-06 NOTE — Telephone Encounter (Signed)
Pt's son called this AM stating that pt has c/o HA and nausea throughout the weekend. Pt started Isosorbide 30mg  qd after ov 09/23/10 with Dr. Mariah Milling. I have advised pt to take Tylenol to see if HA resolves first, pt w/ no c/o at this time of side effects. Told son that it is more than likely from the Isosorbide and have advised that starting with her next dose to try cutting in half to see if side effects improve. He state he will do this and f/u with results, will forward to Dr. Mariah Milling for any futher recc.   Regarding pt's previous problem with low HR, son states that she is now back on Digoxin 0.125mg  and on decr dose of Metoprolol tartrate 50mg  bid (advised to decr form 100 to 50 after readings of HR in low 40s per last phone note), and reports pt's BP/HR have been stable with HR 73-78 and SBP ~154-160.

## 2010-10-06 NOTE — Telephone Encounter (Signed)
Would continue to monitor BP as running a little high. Could retest imdur 30 once she feels better

## 2010-10-07 ENCOUNTER — Telehealth: Payer: Self-pay | Admitting: *Deleted

## 2010-10-07 MED ORDER — ONDANSETRON HCL 4 MG PO TABS: 4.0000 mg | ORAL_TABLET | Freq: Three times a day (TID) | ORAL | Status: AC | PRN

## 2010-10-07 NOTE — Telephone Encounter (Signed)
Son says that patient has been feeling nauseated for the past 2 or 3 days after every thing that she eats. She has had no vomiting. Son says that she is only taking a couple of bites at each meal, this morning she at a handful of dried cereal. Son is asking ifs he could have something for nausea called in or what suggestions you have.

## 2010-10-07 NOTE — Telephone Encounter (Signed)
could be recent Cipro she took for UTI?  May try course of zofran - sent in.  If not better, will need office visit to further evaluate.

## 2010-10-07 NOTE — Telephone Encounter (Signed)
Jenny Burns notified as instructed by telephone. Pt already has appt scheduled next week.

## 2010-10-07 NOTE — Telephone Encounter (Signed)
Spoke to [place], son, and he says that he held pt's Imdur this AM and she is feeling "a little better, but still has nausea." I notified him of msg below, and told him that we do want her on the Imdur, if she wants to try half tab and incr back to whole tablet if she is feeling better. I advised that pt see her PCP if nausea continues, as this may not be medication related. Pt's son ok with this, and will call us with any changes.

## 2010-10-13 ENCOUNTER — Telehealth: Payer: Self-pay | Admitting: *Deleted

## 2010-10-13 NOTE — Telephone Encounter (Signed)
Noted  

## 2010-10-13 NOTE — Telephone Encounter (Signed)
Physical therapist called to let you know that patient had a missed visit this week per patient's request.

## 2010-10-16 ENCOUNTER — Ambulatory Visit (INDEPENDENT_AMBULATORY_CARE_PROVIDER_SITE_OTHER): Payer: Medicare Other | Admitting: Family Medicine

## 2010-10-16 ENCOUNTER — Encounter: Payer: Self-pay | Admitting: Family Medicine

## 2010-10-16 VITALS — BP 148/80 | HR 80 | Temp 98.1°F | Wt 93.8 lb

## 2010-10-16 DIAGNOSIS — N3941 Urge incontinence: Secondary | ICD-10-CM

## 2010-10-16 DIAGNOSIS — R300 Dysuria: Secondary | ICD-10-CM

## 2010-10-16 DIAGNOSIS — M129 Arthropathy, unspecified: Secondary | ICD-10-CM

## 2010-10-16 DIAGNOSIS — L255 Unspecified contact dermatitis due to plants, except food: Secondary | ICD-10-CM

## 2010-10-16 DIAGNOSIS — N390 Urinary tract infection, site not specified: Secondary | ICD-10-CM

## 2010-10-16 LAB — POCT URINALYSIS DIPSTICK
Bilirubin, UA: NEGATIVE
Glucose, UA: NEGATIVE
Nitrite, UA: NEGATIVE
Spec Grav, UA: 1.01

## 2010-10-16 MED ORDER — CLOBETASOL PROPIONATE 0.05 % EX CREA: TOPICAL_CREAM | Freq: Two times a day (BID) | CUTANEOUS | Status: DC

## 2010-10-16 NOTE — Assessment & Plan Note (Signed)
Needs supplies for incontinence. Request written on script and given to pt.

## 2010-10-16 NOTE — Assessment & Plan Note (Signed)
With fatigue, good possibility of ongoing UTI. U/A looks like it also. WIll send for culture.

## 2010-10-16 NOTE — Patient Instructions (Addendum)
Start Citrucel slowly as discussed.  Stop Iron suppl until on Citrucel full tsp daily for at least a week. RTC one month. Note provided for incontinence supplies.

## 2010-10-16 NOTE — Assessment & Plan Note (Signed)
Trial of Clobetasol bid, then prn.

## 2010-10-16 NOTE — Assessment & Plan Note (Signed)
Discussed continuing PT to improve risk of falls.
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
## 2010-10-18 LAB — URINE CULTURE: Colony Count: >=100000

## 2010-10-20 ENCOUNTER — Other Ambulatory Visit: Payer: Self-pay | Admitting: Family Medicine

## 2010-10-27 ENCOUNTER — Telehealth: Payer: Self-pay | Admitting: *Deleted

## 2010-10-27 NOTE — Telephone Encounter (Signed)
Pt's son called, pt's BP has been elevated in AM and will drop in afternoon (he says around 100-114 systolic). Pt's SBP never goes below 100 per son. I notified him that as long as she is not symptomatic with that low BP then, that is not too low. Pt is taking Imdur, Lisinopril and PM metop in evening and only AM metoprolol in morning. I advised that she try taking either the Lisinopril or Imdur in the AM to help balance out BP meds so maybe pressure not so high in AM and so "low" in PM. He is ok with that and will have pt try. He says her HR has been in 50s, so he has been holding Digoxin for about 3 days. He also says pt is having trouble with leg pain and tingling again, and would just like to come in to see Dr. Mariah Milling to go over everything. Pt's appt was moved sooner than Sept. I advised she document BP and HR about 1 week prior to visit and bring in results.

## 2010-10-27 NOTE — Telephone Encounter (Signed)
Son says that since patient started physical therapy she seems to be doing worse. He is having to help her get around and do things a lot more that before. He is asking if she should stop the physical therapy.

## 2010-10-28 NOTE — Telephone Encounter (Signed)
Left message on machine to call back  

## 2010-10-28 NOTE — Telephone Encounter (Signed)
Patient's son notified as instructed by telephone.

## 2010-10-28 NOTE — Telephone Encounter (Signed)
If PT felt to make maters worse, pt may stop Physical Therapy. This was meant to tone muscles and help with mobility and stability.

## 2010-10-30 ENCOUNTER — Ambulatory Visit: Payer: Medicare Other | Admitting: Cardiovascular Disease

## 2010-11-05 ENCOUNTER — Encounter: Payer: Self-pay | Admitting: Cardiovascular Disease

## 2010-11-05 ENCOUNTER — Ambulatory Visit (INDEPENDENT_AMBULATORY_CARE_PROVIDER_SITE_OTHER): Payer: Medicare Other | Admitting: Cardiovascular Disease

## 2010-11-05 DIAGNOSIS — I4891 Unspecified atrial fibrillation: Secondary | ICD-10-CM

## 2010-11-05 DIAGNOSIS — E785 Hyperlipidemia, unspecified: Secondary | ICD-10-CM

## 2010-11-05 DIAGNOSIS — M549 Dorsalgia, unspecified: Secondary | ICD-10-CM

## 2010-11-05 DIAGNOSIS — I739 Peripheral vascular disease, unspecified: Secondary | ICD-10-CM | POA: Insufficient documentation

## 2010-11-05 DIAGNOSIS — R5381 Other malaise: Secondary | ICD-10-CM

## 2010-11-05 DIAGNOSIS — R531 Weakness: Secondary | ICD-10-CM | POA: Insufficient documentation

## 2010-11-05 DIAGNOSIS — I10 Essential (primary) hypertension: Secondary | ICD-10-CM

## 2010-11-05 MED ORDER — AMLODIPINE BESYLATE 5 MG PO TABS: 5.0000 mg | ORAL_TABLET | Freq: Two times a day (BID) | ORAL | Status: DC

## 2010-11-05 NOTE — Assessment & Plan Note (Signed)
She has chronic leg and back pain. She has given up on walking secondary to the pain. She is not particularly interested in taking any pain pills as they make her nauseous.

## 2010-11-05 NOTE — Assessment & Plan Note (Signed)
Heart rate is well controlled on her current medication regimen, she remains in atrial fibrillation with no warfarin secondary to high fall risk.

## 2010-11-05 NOTE — Patient Instructions (Signed)
Please restart amlodipine 5 mg daily Monitor the blood pressure. If the blood pressure continues to be elevated, increase the amlodipine to 10 mg daily. We will have hospice call your home.  Please call us if you have new issues that need to be addressed before your next appt.  We will call you for a follow up Appt. In 3 months

## 2010-11-05 NOTE — Assessment & Plan Note (Signed)
On larger meal ultrasound, her ABIs are markedly abnormal, she has significant aortic outflow disease. She does not want aggressive intervention at this time.

## 2010-11-05 NOTE — Assessment & Plan Note (Signed)
We spent much of the visit talking about her generalized weakness and steady progressive decline. Her son would like to enroll her in hospice for more help at home. We even did discuss home hospice. We will call several local services to see what they can offer.

## 2010-11-05 NOTE — Assessment & Plan Note (Signed)
On her last clinic visit, she was started on a statin for her severe peripheral vascular disease.

## 2010-11-05 NOTE — Assessment & Plan Note (Signed)
We will restart amlodipine 5 mg daily. If her blood pressure continues to be elevated, I have asked her son to call me and we could increase the dose to 10 mg daily.

## 2010-11-05 NOTE — Progress Notes (Signed)
Patient ID: Jenny Burns, female    DOB: Nov 25, 1916, 77 y.o.   MRN: [id number]  HPI Comments: 77 year old woman with a history of frequent falls, traumatic injury to her shoulder requiring surgery, also history of bladder and hysterectomy surgery in 2006 with long hospital course, recent onset of atrial fibrillation Not on anticoagulation as she is a high fall risk candidate, hypertension who presents for routine followup. She has a hx of edema.  Today she reports that she has severe shoulder pain, leg weakness, pain in her feet. She had a lower extremity ultrasound that showed multivessel upper level disease. On her last visit, she did not want aggressive intervention for her peripheral vascular disease. She is not able to walk anymore secondary to her back pain and foot pain and general weakness. Her heart rate has been well-controlled though her blood pressure has been elevated at home With systolic pressures in the 160s to 170s. She does get very stressed about finances and help at home. They have been receiving help from a local facility called [facility]. The son reports that they do not help with what they need in that she does not need physical therapy and she will probably never walk again.  She had lower extremity ABIs that showed significant disease bilaterally. Her feet are typically cold, weak, with purple discoloration.  She has a history of back surgery after breaking her back in 2007.  EKG shows atrial fibrillation with rate of 59 beats per minute, no other significant ST or T wave changes    Outpatient Encounter Prescriptions as of 11/05/2010  Medication Sig Dispense Refill  . aspirin 81 MG tablet Take 1 tablet (81 mg total) by mouth daily.  30 tablet  11  . atorvastatin (LIPITOR) 10 MG tablet Take 1 tablet (10 mg total) by mouth daily.  30 tablet  11  . Cholecalciferol (VITAMIN D3) 400 UNITS CAPS Take 1 tablet by mouth daily.        . digoxin (LANOXIN) 0.125 MG tablet Take 1  tablet (125 mcg total) by mouth daily.  30 tablet  6  . docusate sodium (DULCOLAX) 100 MG capsule Take 100 mg by mouth at bedtime.        . EVISTA 60 MG tablet TAKE ONE TABLET BY MOUTH EVERY DAY  90 each  0  . ferrous sulfate 325 (65 FE) MG tablet Take 325 mg by mouth daily with breakfast.        . furosemide (LASIX) 20 MG tablet Take 1 tablet (20 mg total) by mouth daily as needed.  30 tablet  6  . isosorbide mononitrate (IMDUR) 30 MG 24 hr tablet Take 1 tablet (30 mg total) by mouth daily.  30 tablet  11  . levothyroxine (SYNTHROID, LEVOTHROID) 50 MCG tablet Take 50 mcg by mouth daily.        Marland Kitchen lisinopril (PRINIVIL,ZESTRIL) 20 MG tablet TAKE ONE TABLET BY MOUTH EVERY DAY  90 tablet  1  . metoprolol (LOPRESSOR) 100 MG tablet Take 1/2 by mouth two times a day       . Multiple Vitamin (MULTIVITAMINS PO) Take 1 tablet by mouth daily.        . phenazopyridine (PYRIDIUM) 200 MG tablet Take 200 mg by mouth 3 (three) times daily as needed.        . vitamin B-12 (CYANOCOBALAMIN) 1000 MCG tablet Take 1,000 mcg by mouth daily.        . clobetasol (TEMOVATE) 0.05 % cream Apply topically  2 (two) times daily.  30 g  0     Review of Systems  Constitutional: Positive for activity change.  HENT: Negative.   Eyes: Negative.   Respiratory: Negative.   Cardiovascular: Negative.        Cold discolored feet bilaterally  Gastrointestinal: Negative.   Musculoskeletal: Positive for back pain, arthralgias and gait problem.  Skin: Negative.   Neurological: Positive for weakness.  Hematological: Negative.   Psychiatric/Behavioral: Negative.   All other systems reviewed and are negative.    BP 200/80  Pulse 58  Ht 4\' 10"  (1.473 m)  Wt 81 lb (36.741 kg)  BMI 16.93 kg/m2   Physical Exam  Nursing note and vitals reviewed. Constitutional: She is oriented to person, place, and time. She appears well-developed and well-nourished.       Sitting in a wheelchair, unable to ambulate easily  HENT:  Head:  Normocephalic.  Nose: Nose normal.  Mouth/Throat: Oropharynx is clear and moist.  Eyes: Conjunctivae are normal. Pupils are equal, round, and reactive to light.  Neck: Normal range of motion. Neck supple. No JVD present.  Cardiovascular: Normal rate, S1 normal, S2 normal and intact distal pulses.  An irregularly irregular rhythm present. Exam reveals decreased pulses. Exam reveals no gallop and no friction rub.   Murmur heard.  Crescendo systolic murmur is present with a grade of 1/6  Pulses:      Carotid pulses are 2+ on the right side, and 2+ on the left side.      Radial pulses are 2+ on the right side, and 2+ on the left side.       Dorsalis pedis pulses are 0 on the right side, and 0 on the left side.       Posterior tibial pulses are 0 on the right side, and 0 on the left side.  Pulmonary/Chest: Effort normal and breath sounds normal. No respiratory distress. She has no wheezes. She has no rales. She exhibits no tenderness.  Abdominal: Soft. Bowel sounds are normal. She exhibits no distension. There is no tenderness.  Musculoskeletal: Normal range of motion. She exhibits no edema and no tenderness.  Lymphadenopathy:    She has no cervical adenopathy.  Neurological: She is alert and oriented to person, place, and time. Coordination normal.  Skin: Skin is warm and dry. No rash noted. No erythema.  Psychiatric: She has a normal mood and affect. Her behavior is normal. Judgment and thought content normal.         Assessment and Plan

## 2010-11-07 ENCOUNTER — Telehealth: Payer: Self-pay

## 2010-11-07 NOTE — Telephone Encounter (Signed)
Calling with Jenny Burns need to know if can have a home health aid come to her house. If so needs an order from Dr. Mariah Milling faxed to [phone]; att. Tacia Hindley stating it is okay for a home health aid to come.

## 2010-11-08 NOTE — Telephone Encounter (Signed)
Ok to have home health aide

## 2010-11-10 NOTE — Telephone Encounter (Signed)
Spoke to Amy, orders were already sent and [facility] was on the order. Amy states that she received verbal order from [place] so they may have left that out. Will resend the order to her at *[phone] per Amy.

## 2010-11-12 ENCOUNTER — Encounter: Payer: Self-pay | Admitting: Family Medicine

## 2010-11-20 ENCOUNTER — Encounter: Payer: Self-pay | Admitting: Family Medicine

## 2010-11-20 ENCOUNTER — Ambulatory Visit (INDEPENDENT_AMBULATORY_CARE_PROVIDER_SITE_OTHER): Payer: Medicare Other | Admitting: Family Medicine

## 2010-11-20 DIAGNOSIS — L255 Unspecified contact dermatitis due to plants, except food: Secondary | ICD-10-CM

## 2010-11-20 DIAGNOSIS — R5383 Other fatigue: Secondary | ICD-10-CM

## 2010-11-20 DIAGNOSIS — I10 Essential (primary) hypertension: Secondary | ICD-10-CM

## 2010-11-20 DIAGNOSIS — R300 Dysuria: Secondary | ICD-10-CM

## 2010-11-20 NOTE — Assessment & Plan Note (Signed)
Barely adequate today but in order to help depression, will try decreasing Metoprolol and watch BP. If goes up, will increase Prinivil. Assess mental outlook next time.

## 2010-11-20 NOTE — Assessment & Plan Note (Signed)
Acute sxs resolved and pt seems to feel better since last treated for the latest UTI.

## 2010-11-20 NOTE — Assessment & Plan Note (Signed)
Improved slightly since last trmt for acute UTI.

## 2010-11-20 NOTE — Assessment & Plan Note (Signed)
Resolved

## 2010-11-20 NOTE — Progress Notes (Signed)
  Subjective:    Patient ID: Jenny Burns, female    DOB: 11/20/1916, 77 y.o.   MRN: [id number]  HPI Pt here with her son and her sister for one month followup. Had constipation and discussed starting Citrucel and then back on iron. She had trouble taking the Citrucel so is using Pepto Bismol when needed. She had a BM yesterday that she felt was nml. She has seen Dr Mariah Milling since being here with stability of cardiac issues except for BP for which he reinstituted Amlodipine. She has felt better since being on this  but has been noted to be down lately. His note said he would institute Hospice Care, however she is getting Physical Therapy in the home which has helped significantly and she is now able to get out of the bed or chair and get around some with the use of her walker.  She seems to feel better as when depression was discussed, she actually brightened up and smiled to assure me she was not depressed. Her son and sister both agree that she has been down lately.    Review of Systems  Constitutional: Negative for fever, chills, diaphoresis, activity change and fatigue.  HENT: Negative for ear pain, congestion, rhinorrhea and postnasal drip.   Eyes: Negative for redness.  Respiratory: Negative for cough, chest tightness, shortness of breath and wheezing.   Cardiovascular: Negative for chest pain.       Objective:   Physical Exam  Constitutional: She appears well-developed and well-nourished. No distress.  HENT:  Head: Normocephalic and atraumatic.  Right Ear: External ear normal.  Left Ear: External ear normal.  Nose: Nose normal.  Mouth/Throat: Oropharynx is clear and moist. No oropharyngeal exudate.  Eyes: Conjunctivae and EOM are normal. Pupils are equal, round, and reactive to light.  Neck: Normal range of motion. Neck supple. No thyromegaly present.  Cardiovascular: Normal rate, regular rhythm and normal heart sounds.   Pulmonary/Chest: Effort normal and breath sounds normal. She  has no wheezes. She has no rales.  Lymphadenopathy:    She has no cervical adenopathy.  Skin: She is not diaphoretic.          Assessment & Plan:

## 2010-11-20 NOTE — Patient Instructions (Signed)
RTC 2 mos for recheck. Decrease Metoprolol to 1/2 a day to see if helps Mental outlook. If BP consistently high, increase Prinivil to 2 tabs a day.

## 2010-11-26 ENCOUNTER — Telehealth: Payer: Self-pay | Admitting: *Deleted

## 2010-11-26 NOTE — Telephone Encounter (Signed)
Please give verbal order to do urinalysis. Strong odor will be worsened by concentration. Drink lots of fluids.

## 2010-11-26 NOTE — Telephone Encounter (Signed)
Patient is complaining of burning and pain when urinating.  The urine also has a strong odor.  Can they get an order to do a urinalysis?

## 2010-11-26 NOTE — Telephone Encounter (Signed)
Randi at [facility] notified as instructed by telephone. Was informed by Donnal Debar that he had advised the patient to drink lots of fluids.

## 2010-12-01 ENCOUNTER — Telehealth: Payer: Self-pay | Admitting: *Deleted

## 2010-12-01 ENCOUNTER — Encounter: Payer: Self-pay | Admitting: Family Medicine

## 2010-12-01 MED ORDER — CEPHALEXIN 500 MG PO CAPS: 500.0000 mg | ORAL_CAPSULE | Freq: Two times a day (BID) | ORAL | Status: AC

## 2010-12-01 NOTE — Telephone Encounter (Signed)
Please fax over order for keflex 500mg  po bid x10d, #20, no rf.  UTI with E coli, should be treatable with keflex.  Thanks.

## 2010-12-01 NOTE — Telephone Encounter (Signed)
Order faxed as instructed. 

## 2010-12-01 NOTE — Telephone Encounter (Signed)
Nurse from [facility] has faxed pt's urine culture results, fax is on your desk.  There fax number is [phone].

## 2010-12-02 NOTE — Telephone Encounter (Signed)
Called and spoke to [facility] at [place] to make sure that they got the faxed rx. Was informed by [facility] that rx would need to be called to pharmacy. Spoke to patient's daughter and advised her that patient needs to be on an antibiotic for a UTI. Prescription called to [facility] [address] per patient's request.

## 2010-12-03 ENCOUNTER — Other Ambulatory Visit: Payer: Self-pay | Admitting: Family Medicine

## 2010-12-04 ENCOUNTER — Encounter: Payer: Self-pay | Admitting: Family Medicine

## 2010-12-23 ENCOUNTER — Telehealth: Payer: Self-pay | Admitting: *Deleted

## 2010-12-23 NOTE — Telephone Encounter (Signed)
Physical therapist is asking for verbal order to extend pt's therapy once a week for a few more weeks.  Please advise.

## 2010-12-24 ENCOUNTER — Ambulatory Visit: Payer: Medicare Other | Admitting: Cardiovascular Disease

## 2010-12-24 NOTE — Telephone Encounter (Signed)
Chris notified as instructed by telephone.   

## 2010-12-24 NOTE — Telephone Encounter (Signed)
Please extend as long as she thinks therapy is benefiting the pt.

## 2010-12-24 NOTE — Telephone Encounter (Signed)
Left message on machine to call back  

## 2011-01-02 LAB — URINALYSIS, ROUTINE W REFLEX MICROSCOPIC
Bilirubin Urine: NEGATIVE
Specific Gravity, Urine: 1.014
Urobilinogen, UA: 0.2
pH: 7.5

## 2011-01-02 LAB — COMPREHENSIVE METABOLIC PANEL
ALT: 18
Albumin: 3.7
Alkaline Phosphatase: 60
Calcium: 8.9
GFR calc Af Amer: >60
Potassium: 4
Sodium: 138
Total Protein: 6.1

## 2011-01-02 LAB — TSH: TSH: 4.233

## 2011-01-02 LAB — CBC
Platelets: 190
RDW: 15.1

## 2011-01-02 LAB — RETICULOCYTES: RBC.: 3.58 — ABNORMAL LOW

## 2011-01-02 LAB — IRON AND TIBC
Iron: 18 — ABNORMAL LOW
Saturation Ratios: 8 — ABNORMAL LOW
UIBC: 217

## 2011-01-02 LAB — URINE MICROSCOPIC-ADD ON

## 2011-01-02 LAB — DIFFERENTIAL
Basophils Relative: 0
Eosinophils Absolute: 0
Lymphs Abs: 0.6 — ABNORMAL LOW
Monocytes Absolute: 0.2
Monocytes Relative: 4

## 2011-01-02 LAB — FERRITIN: Ferritin: 628 — ABNORMAL HIGH (ref 10–291)

## 2011-01-21 ENCOUNTER — Encounter: Payer: Self-pay | Admitting: Family Medicine

## 2011-01-21 ENCOUNTER — Ambulatory Visit (INDEPENDENT_AMBULATORY_CARE_PROVIDER_SITE_OTHER): Payer: Medicare Other | Admitting: Family Medicine

## 2011-01-21 VITALS — BP 120/60 | HR 72 | Temp 98.3°F | Wt 90.8 lb

## 2011-01-21 DIAGNOSIS — R300 Dysuria: Secondary | ICD-10-CM

## 2011-01-21 DIAGNOSIS — I10 Essential (primary) hypertension: Secondary | ICD-10-CM

## 2011-01-21 DIAGNOSIS — R5383 Other fatigue: Secondary | ICD-10-CM

## 2011-01-21 DIAGNOSIS — Z23 Encounter for immunization: Secondary | ICD-10-CM

## 2011-01-21 DIAGNOSIS — R531 Weakness: Secondary | ICD-10-CM

## 2011-01-21 DIAGNOSIS — R5381 Other malaise: Secondary | ICD-10-CM

## 2011-01-21 NOTE — Assessment & Plan Note (Signed)
Good control. Cont curr meds. BP Readings from Last 3 Encounters:  01/21/11 120/60  11/20/10 140/80  11/05/10 200/80

## 2011-01-21 NOTE — Assessment & Plan Note (Signed)
Finally improved. Currently no symptoms.

## 2011-01-21 NOTE — Assessment & Plan Note (Signed)
Improved. Seems resolved for now.

## 2011-01-21 NOTE — Progress Notes (Signed)
  Subjective:    Patient ID: Jenny Burns, female    DOB: [birth year], 77 y.o.   MRN: [id number]  HPIPt here with son for two month follow up. She gets about with her walker in the house better since having the home PT. She doesn't get out much without being in the wheelchair and doesn't do much of that, but is comfortable with her situation and I'm not sure there is anything further that can be done. She has no complaints today and feels well. She looks good today, by far the best color and sense of energy she has had in quite some time.    Review of Systems  Constitutional: Negative for fever, chills, diaphoresis, activity change and fatigue.  HENT: Negative for ear pain, congestion, rhinorrhea and postnasal drip.   Eyes: Negative for redness.  Respiratory: Negative for cough, chest tightness, shortness of breath and wheezing.   Cardiovascular: Negative for chest pain.       Objective:   Physical Exam  Constitutional: She appears well-developed and well-nourished. No distress.  HENT:  Head: Normocephalic and atraumatic.  Right Ear: External ear normal.  Left Ear: External ear normal.  Nose: Nose normal.  Mouth/Throat: Oropharynx is clear and moist. No oropharyngeal exudate.  Eyes: Conjunctivae and EOM are normal. Pupils are equal, round, and reactive to light.  Neck: Normal range of motion. Neck supple. No thyromegaly present.  Cardiovascular: Normal rate, regular rhythm and normal heart sounds.   Pulmonary/Chest: Effort normal and breath sounds normal. She has no wheezes. She has no rales.  Abdominal:       No suprapubic discomfort with palpation.  Genitourinary:       No CVAT.  Lymphadenopathy:    She has no cervical adenopathy.  Skin: She is not diaphoretic.          Assessment & Plan:

## 2011-01-21 NOTE — Patient Instructions (Signed)
Call if sxs worsen. 

## 2011-01-21 NOTE — Assessment & Plan Note (Addendum)
Seems improved. Perhaps finally ridding her totally of infection has actually improved her sense of well being and fatigue.

## 2011-01-22 LAB — URINALYSIS, ROUTINE W REFLEX MICROSCOPIC
Hgb urine dipstick: NEGATIVE
Nitrite: NEGATIVE
Protein, ur: NEGATIVE
Specific Gravity, Urine: 1.009
Urobilinogen, UA: 0.2

## 2011-01-22 LAB — BASIC METABOLIC PANEL
BUN: 10
CO2: 25
Chloride: 99
Creatinine, Ser: 0.79
GFR calc Af Amer: >60
Potassium: 4.4

## 2011-01-22 LAB — PROTIME-INR
INR: 1
INR: 1.1
Prothrombin Time: 14.9
Prothrombin Time: 18.9 — ABNORMAL HIGH
Prothrombin Time: 20.3 — ABNORMAL HIGH

## 2011-01-22 LAB — COMPREHENSIVE METABOLIC PANEL
Albumin: 2.8 — ABNORMAL LOW
Albumin: 4.1
Alkaline Phosphatase: 75
BUN: 10
BUN: 11
Calcium: 7.8 — ABNORMAL LOW
Calcium: 9.5
Creatinine, Ser: 0.79
Glucose, Bld: 117 — ABNORMAL HIGH
Glucose, Bld: 148 — ABNORMAL HIGH
Potassium: 3.6
Total Protein: 4.6 — ABNORMAL LOW
Total Protein: 6.7

## 2011-01-22 LAB — URINE CULTURE

## 2011-01-22 LAB — CBC
HCT: 23.5 — ABNORMAL LOW
HCT: 26.7 — ABNORMAL LOW
HCT: 29.8 — ABNORMAL LOW
Hemoglobin: 10.3 — ABNORMAL LOW
Hemoglobin: 8.6 — ABNORMAL LOW
Hemoglobin: 9.4 — ABNORMAL LOW
MCHC: 34.7
MCHC: 35.1
MCHC: 35.2
MCV: 90.5
MCV: 91.8
Platelets: 203
Platelets: 328
RBC: 2.6 — ABNORMAL LOW
RBC: 2.69 — ABNORMAL LOW
RDW: 13.6
RDW: 13.6
WBC: 6.4

## 2011-01-22 LAB — DIFFERENTIAL
Basophils Relative: 0
Lymphocytes Relative: 12
Monocytes Absolute: 0.5
Monocytes Relative: 5
Neutro Abs: 8.7 — ABNORMAL HIGH
Neutrophils Relative %: 82 — ABNORMAL HIGH

## 2011-01-22 LAB — TYPE AND SCREEN: ABO/RH(D): B POS

## 2011-01-22 LAB — APTT: aPTT: 30

## 2011-01-22 LAB — SAMPLE TO BLOOD BANK

## 2011-01-30 ENCOUNTER — Encounter: Payer: Self-pay | Admitting: Cardiovascular Disease

## 2011-02-09 ENCOUNTER — Encounter: Payer: Self-pay | Admitting: Cardiovascular Disease

## 2011-02-09 ENCOUNTER — Ambulatory Visit (INDEPENDENT_AMBULATORY_CARE_PROVIDER_SITE_OTHER): Payer: Medicare Other | Admitting: Cardiovascular Disease

## 2011-02-09 DIAGNOSIS — I739 Peripheral vascular disease, unspecified: Secondary | ICD-10-CM

## 2011-02-09 DIAGNOSIS — R609 Edema, unspecified: Secondary | ICD-10-CM

## 2011-02-09 DIAGNOSIS — I10 Essential (primary) hypertension: Secondary | ICD-10-CM

## 2011-02-09 DIAGNOSIS — E785 Hyperlipidemia, unspecified: Secondary | ICD-10-CM

## 2011-02-09 DIAGNOSIS — I4891 Unspecified atrial fibrillation: Secondary | ICD-10-CM

## 2011-02-09 NOTE — Progress Notes (Signed)
Patient ID: Jenny Burns, female    DOB: [birth year], 77 y.o.   MRN: [id number]  HPI Comments: 77 year old woman with a history of frequent falls, traumatic injury to her shoulder requiring surgery, also history of bladder and hysterectomy surgery in 2006 with long hospital course, recent onset of atrial fibrillation Not on anticoagulation as she is a high fall risk candidate, hypertension who presents for routine followup. She has a hx of edema.  She reports that she has been working for a long period with [place] visiting nurses and physical therapy. She has reached a point where her legs are much stronger and she can walk with a walker around her house. She is able to get out of bed to go to the bathroom. Her son is quite pleased with her energy level and reports that she often looks quite good though she continues to have occasional falls. Her friend that accompanies with her reports that she has had a dozen falls with numerous broken bones. None of the family or the patient think that she is a Coumadin candidate.  She has had problems with urinary tract infections in the past that have required prolonged periods of antibiotics. Edema has been well controlled recently. She has not been taking Lasix for quite some time.  She had lower extremity ABIs that showed significant disease bilaterally. Her feet are typically cold, weak, with purple discoloration.  She has a history of back surgery after breaking her back in 2007.  EKG shows atrial fibrillation with rate of 68 beats per minute, no other significant ST or T wave changes    Outpatient Encounter Prescriptions as of 02/09/2011  Medication Sig Dispense Refill  . amLODipine (NORVASC) 5 MG tablet Take 5 mg by mouth daily.        Marland Kitchen aspirin 81 MG tablet Take 81 mg by mouth daily as needed.        Marland Kitchen atorvastatin (LIPITOR) 10 MG tablet Take 1 tablet (10 mg total) by mouth daily.  30 tablet  11  . Cholecalciferol (VITAMIN D3) 400 UNITS CAPS Take 1  tablet by mouth daily.        . digoxin (LANOXIN) 0.125 MG tablet Take 1 tablet (125 mcg total) by mouth daily.  30 tablet  6  . docusate sodium (DULCOLAX) 100 MG capsule Take 100 mg by mouth at bedtime.        . EVISTA 60 MG tablet TAKE ONE TABLET BY MOUTH EVERY DAY  90 each  0  . furosemide (LASIX) 20 MG tablet Take 1 tablet (20 mg total) by mouth daily as needed.  30 tablet  6  . isosorbide mononitrate (IMDUR) 30 MG 24 hr tablet Take 1 tablet (30 mg total) by mouth daily.  30 tablet  11  . levothyroxine (SYNTHROID, LEVOTHROID) 50 MCG tablet TAKE ONE TABLET BY MOUTH EVERY DAY  90 tablet  2  . lisinopril (PRINIVIL,ZESTRIL) 20 MG tablet TAKE ONE TABLET BY MOUTH EVERY DAY  90 tablet  1  . metoprolol (LOPRESSOR) 100 MG tablet Take 50 mg by mouth daily.       . Multiple Vitamin (MULTIVITAMINS PO) Take 1 tablet by mouth daily.        . vitamin B-12 (CYANOCOBALAMIN) 1000 MCG tablet Take 1,000 mcg by mouth daily.           Review of Systems  Constitutional: Positive for activity change.  HENT: Negative.   Eyes: Negative.   Respiratory: Negative.   Cardiovascular: Negative.  Cold discolored feet bilaterally  Gastrointestinal: Negative.   Musculoskeletal: Positive for back pain, arthralgias and gait problem.  Skin: Negative.   Neurological: Positive for weakness.  Hematological: Negative.   Psychiatric/Behavioral: Negative.   All other systems reviewed and are negative.    BP 162/68  Pulse 68  Ht 4\' 10"  (1.473 m)  Wt 93 lb (42.185 kg)  BMI 19.44 kg/m2   Physical Exam  Nursing note and vitals reviewed. Constitutional: She is oriented to person, place, and time. She appears well-developed and well-nourished.       Sitting in a wheelchair, unable to ambulate easily  HENT:  Head: Normocephalic.  Nose: Nose normal.  Mouth/Throat: Oropharynx is clear and moist.  Eyes: Conjunctivae are normal. Pupils are equal, round, and reactive to light.  Neck: Normal range of motion. Neck  supple. No JVD present.  Cardiovascular: Normal rate, S1 normal, S2 normal and intact distal pulses.  An irregularly irregular rhythm present. Exam reveals decreased pulses. Exam reveals no gallop and no friction rub.   Murmur heard.  Crescendo systolic murmur is present with a grade of 1/6  Pulses:      Carotid pulses are 2+ on the right side, and 2+ on the left side.      Radial pulses are 2+ on the right side, and 2+ on the left side.       Dorsalis pedis pulses are 0 on the right side, and 0 on the left side.       Posterior tibial pulses are 0 on the right side, and 0 on the left side.  Pulmonary/Chest: Effort normal and breath sounds normal. No respiratory distress. She has no wheezes. She has no rales. She exhibits no tenderness.  Abdominal: Soft. Bowel sounds are normal. She exhibits no distension. There is no tenderness.  Musculoskeletal: Normal range of motion. She exhibits no edema and no tenderness.  Lymphadenopathy:    She has no cervical adenopathy.  Neurological: She is alert and oriented to person, place, and time. Coordination normal.  Skin: Skin is warm and dry. No rash noted. No erythema.  Psychiatric: She has a normal mood and affect. Her behavior is normal. Judgment and thought content normal.         Assessment and Plan

## 2011-02-09 NOTE — Patient Instructions (Signed)
You are doing well. No medication changes were made.  Please call us if you have new issues that need to be addressed before your next appt.  The office will contact you for a follow up Appt. In 6 months  

## 2011-02-09 NOTE — Assessment & Plan Note (Signed)
Minimal edema on her visit today. She is quite happy. She is not taking Lasix.

## 2011-02-09 NOTE — Assessment & Plan Note (Signed)
We'll continue medical management. She was not very symptomatic at today's visit and is actually walking more than her baseline after working with PT.

## 2011-02-09 NOTE — Assessment & Plan Note (Signed)
Continued chronic atrial fibrillation with adequate rate control. She is not interested in anticoagulation and she would be high risk given her falls.

## 2011-02-09 NOTE — Assessment & Plan Note (Signed)
Blood pressure does continued to be mildly elevated. We have suggested that she could increase the amlodipine to 5 mg b.i.d.. They state the blood pressure is somewhat labile and they would prefer to monitor it for now.

## 2011-02-09 NOTE — Assessment & Plan Note (Signed)
Cholesterol is close to goal on her current medication regimen. No further changes made.

## 2011-03-11 ENCOUNTER — Ambulatory Visit (INDEPENDENT_AMBULATORY_CARE_PROVIDER_SITE_OTHER): Payer: Medicare Other | Admitting: Internal Medicine

## 2011-03-11 ENCOUNTER — Encounter: Payer: Self-pay | Admitting: Internal Medicine

## 2011-03-11 DIAGNOSIS — D510 Vitamin B12 deficiency anemia due to intrinsic factor deficiency: Secondary | ICD-10-CM

## 2011-03-11 DIAGNOSIS — N3941 Urge incontinence: Secondary | ICD-10-CM

## 2011-03-11 DIAGNOSIS — D508 Other iron deficiency anemias: Secondary | ICD-10-CM

## 2011-03-11 DIAGNOSIS — I4891 Unspecified atrial fibrillation: Secondary | ICD-10-CM

## 2011-03-11 DIAGNOSIS — I10 Essential (primary) hypertension: Secondary | ICD-10-CM

## 2011-03-11 DIAGNOSIS — E039 Hypothyroidism, unspecified: Secondary | ICD-10-CM

## 2011-03-11 LAB — COMPREHENSIVE METABOLIC PANEL
ALT: 23 U/L (ref 0–35)
AST: 32 U/L (ref 0–37)
Alkaline Phosphatase: 81 U/L (ref 39–117)
BUN: 10 mg/dL (ref 6–23)
Chloride: 102 mEq/L (ref 96–112)
Creatinine, Ser: 0.8 mg/dL (ref 0.4–1.2)
Total Bilirubin: 0.5 mg/dL (ref 0.3–1.2)

## 2011-03-11 LAB — CBC WITH DIFFERENTIAL/PLATELET
Basophils Absolute: 0 10*3/uL (ref 0.0–0.1)
Basophils Relative: 0.6 % (ref 0.0–3.0)
Eosinophils Absolute: 0.1 10*3/uL (ref 0.0–0.7)
Hemoglobin: 13.3 g/dL (ref 12.0–15.0)
Lymphocytes Relative: 22.5 % (ref 12.0–46.0)
Lymphs Abs: 1.1 10*3/uL (ref 0.7–4.0)
MCHC: 34 g/dL (ref 30.0–36.0)
MCV: 93.8 fl (ref 78.0–100.0)
Monocytes Absolute: 0.4 10*3/uL (ref 0.1–1.0)
Neutro Abs: 3.1 10*3/uL (ref 1.4–7.7)
RDW: 13.5 % (ref 11.5–14.6)

## 2011-03-11 NOTE — Assessment & Plan Note (Signed)
Will check TSH with labs today. 

## 2011-03-11 NOTE — Assessment & Plan Note (Signed)
Will check CBC with labs today. 

## 2011-03-11 NOTE — Progress Notes (Signed)
Subjective:    Patient ID: Jenny Burns, female    DOB: 12-15-1916, 77 y.o.   MRN: [id number]  HPI 77YO female with HTN, HL, hypothyroidism, and atrial fibrillation presents to establish care, as her previous physician retired.  She denies any complaints today.  She reports her appetite is much improved and she has gained 12 lbs.  Her son cares for her and prepares all of her meals.  Her home is set up with bedside commode and walker for her to use.  She has had falls and several fractures in the past, but none recently.  She has some chronic constipation for which she takes ducolax.  She notes that her son has been weaning her off her Evista at the instruction of her previous physician. She takes Evista about 2x per week.    Outpatient Encounter Prescriptions as of 03/11/2011  Medication Sig Dispense Refill  . amLODipine (NORVASC) 5 MG tablet Take 5 mg by mouth daily.        Marland Kitchen aspirin 81 MG tablet Take 81 mg by mouth daily as needed.        Marland Kitchen atorvastatin (LIPITOR) 10 MG tablet Take 1 tablet (10 mg total) by mouth daily.  30 tablet  11  . Cholecalciferol (VITAMIN D3) 400 UNITS CAPS Take 1 tablet by mouth daily.        . digoxin (LANOXIN) 0.125 MG tablet Take 1 tablet (125 mcg total) by mouth daily.  30 tablet  6  . docusate sodium (DULCOLAX) 100 MG capsule Take 100 mg by mouth at bedtime.        . EVISTA 60 MG tablet TAKE ONE TABLET BY MOUTH EVERY DAY  90 each  0  . furosemide (LASIX) 20 MG tablet Take 1 tablet (20 mg total) by mouth daily as needed.  30 tablet  6  . isosorbide mononitrate (IMDUR) 30 MG 24 hr tablet Take 1 tablet (30 mg total) by mouth daily.  30 tablet  11  . levothyroxine (SYNTHROID, LEVOTHROID) 50 MCG tablet TAKE ONE TABLET BY MOUTH EVERY DAY  90 tablet  2  . lisinopril (PRINIVIL,ZESTRIL) 20 MG tablet TAKE ONE TABLET BY MOUTH EVERY DAY  90 tablet  1  . metoprolol (LOPRESSOR) 100 MG tablet Take 50 mg by mouth daily.       . Multiple Vitamin (MULTIVITAMINS PO) Take 1 tablet  by mouth daily.        . vitamin B-12 (CYANOCOBALAMIN) 1000 MCG tablet Take 1,000 mcg by mouth daily.          Review of Systems  Constitutional: Negative for fever, chills, appetite change, fatigue and unexpected weight change.  HENT: Negative for ear pain, congestion, sore throat, trouble swallowing, neck pain, voice change and sinus pressure.   Eyes: Negative for visual disturbance.  Respiratory: Negative for cough, shortness of breath, wheezing and stridor.   Cardiovascular: Negative for chest pain, palpitations and leg swelling.  Gastrointestinal: Positive for constipation. Negative for nausea, abdominal pain and diarrhea.  Genitourinary: Negative for dysuria and flank pain.  Musculoskeletal: Positive for arthralgias and gait problem (uses walker). Negative for myalgias.  Skin: Negative for color change and rash.  Neurological: Positive for weakness. Negative for dizziness and headaches.  Hematological: Negative for adenopathy. Does not bruise/bleed easily.  Psychiatric/Behavioral: Negative for suicidal ideas, sleep disturbance and dysphoric mood. The patient is not nervous/anxious.    BP 134/60  Pulse 64  Temp(Src) 97.7 F (36.5 C) (Oral)  SpO2 97%  Objective:   Physical Exam  Constitutional: She is oriented to person, place, and time. She appears well-developed and well-nourished. No distress.       Thin, sitting in wheelchair, accompanied by son  HENT:  Head: Normocephalic and atraumatic.  Right Ear: External ear normal.  Left Ear: External ear normal.  Nose: Nose normal.  Mouth/Throat: Oropharynx is clear and moist. No oropharyngeal exudate.  Eyes: Conjunctivae are normal. Pupils are equal, round, and reactive to light. Right eye exhibits no discharge. Left eye exhibits no discharge. No scleral icterus.  Neck: Normal range of motion. Neck supple. No tracheal deviation present. No thyromegaly present.  Cardiovascular: Normal rate, normal heart sounds and intact distal  pulses.  An irregularly irregular rhythm present. Exam reveals no gallop and no friction rub.   No murmur heard. Pulmonary/Chest: Effort normal and breath sounds normal. No respiratory distress. She has no wheezes. She has no rales. She exhibits no tenderness.  Musculoskeletal: Normal range of motion. She exhibits no edema and no tenderness.  Lymphadenopathy:    She has no cervical adenopathy.  Neurological: She is alert and oriented to person, place, and time. No cranial nerve deficit. She exhibits normal muscle tone. Coordination normal.  Skin: Skin is warm and dry. No rash noted. She is not diaphoretic. No erythema. No pallor.  Psychiatric: She has a normal mood and affect. Her behavior is normal. Judgment and thought content normal.          Assessment & Plan:

## 2011-03-11 NOTE — Assessment & Plan Note (Signed)
Rate controlled. Not a candidate for anticoagulation because of risk of falls. Will continue current meds.

## 2011-03-11 NOTE — Assessment & Plan Note (Signed)
Persistent. Using diaper now.  S/p bladder tac in past.  Will monitor closely for UTI given recent history.

## 2011-04-28 ENCOUNTER — Other Ambulatory Visit: Payer: Self-pay | Admitting: Family Medicine

## 2011-05-04 ENCOUNTER — Telehealth: Payer: Self-pay | Admitting: Internal Medicine

## 2011-05-04 NOTE — Telephone Encounter (Signed)
[phone] MR [facility] CALLED ABOUT Jenny Burns LISINOPRIL [address]  FAX REQUEST 1/18PLEASE ADVISE WHEN RX IS CALLED IN PT TOOK LAST ONE TODAY  COMPLETELY OUT OF MEDS

## 2011-05-05 ENCOUNTER — Other Ambulatory Visit: Payer: Self-pay | Admitting: *Deleted

## 2011-05-05 MED ORDER — LISINOPRIL 20 MG PO TABS: 20.0000 mg | ORAL_TABLET | Freq: Every day | ORAL | Status: DC

## 2011-05-12 ENCOUNTER — Other Ambulatory Visit: Payer: Self-pay | Admitting: Cardiovascular Disease

## 2011-05-13 ENCOUNTER — Other Ambulatory Visit (INDEPENDENT_AMBULATORY_CARE_PROVIDER_SITE_OTHER): Payer: Medicare Other | Admitting: *Deleted

## 2011-05-13 ENCOUNTER — Telehealth: Payer: Self-pay

## 2011-05-13 ENCOUNTER — Telehealth: Payer: Self-pay | Admitting: *Deleted

## 2011-05-13 DIAGNOSIS — N390 Urinary tract infection, site not specified: Secondary | ICD-10-CM

## 2011-05-13 LAB — POCT URINALYSIS DIPSTICK
Glucose, UA: NEGATIVE
Ketones, UA: NEGATIVE
Leukocytes, UA: NEGATIVE
Protein, UA: NEGATIVE
Spec Grav, UA: 1.01

## 2011-05-13 MED ORDER — DIGOXIN 125 MCG PO TABS: 125.0000 ug | ORAL_TABLET | Freq: Every day | ORAL | Status: DC

## 2011-05-13 MED ORDER — CIPROFLOXACIN HCL 250 MG PO TABS: 250.0000 mg | ORAL_TABLET | Freq: Two times a day (BID) | ORAL | Status: AC

## 2011-05-13 NOTE — Telephone Encounter (Signed)
Refill sent for digoxin. 

## 2011-05-13 NOTE — Telephone Encounter (Signed)
Message copied by Vernie Murders on Wed May 13, 2011  5:35 PM ------      Message from: Ronna Polio A      Created: Wed May 13, 2011  3:04 PM       I would send urine for culture and treat with Cipro 250mg  po bid x 7 days.

## 2011-05-13 NOTE — Telephone Encounter (Signed)
Patient informed, RX called in 

## 2011-05-16 LAB — URINE CULTURE: Colony Count: >=100000

## 2011-05-20 ENCOUNTER — Telehealth: Payer: Self-pay | Admitting: *Deleted

## 2011-05-20 MED ORDER — CEPHALEXIN 500 MG PO CAPS: 500.0000 mg | ORAL_CAPSULE | Freq: Three times a day (TID) | ORAL | Status: AC

## 2011-05-20 NOTE — Telephone Encounter (Signed)
Message copied by Vernie Murders on Wed May 20, 2011  6:37 PM ------      Message from: Ronna Polio A      Created: Mon May 18, 2011  7:56 AM       This is Teresa's pt.  We treated with cipro, but urine culture is resistant.  Would rec change to Bactrim DS po bid x 7 days, if no allergy

## 2011-05-20 NOTE — Telephone Encounter (Signed)
Pt has sulfa allergy. I advised her to check w/her pharm tomorrow. Please let me know what to send in or send in alt RX to [facility] on file

## 2011-05-20 NOTE — Telephone Encounter (Signed)
Spoke w/MD - new rx sent in, pt's son is aware to check pharm.

## 2011-06-03 ENCOUNTER — Other Ambulatory Visit: Payer: Self-pay | Admitting: *Deleted

## 2011-06-03 MED ORDER — METOPROLOL TARTRATE 100 MG PO TABS: 50.0000 mg | ORAL_TABLET | Freq: Two times a day (BID) | ORAL | Status: DC

## 2011-06-04 ENCOUNTER — Telehealth: Payer: Self-pay | Admitting: Internal Medicine

## 2011-06-04 NOTE — Telephone Encounter (Signed)
[phone] or cell [phone] linwood came in today to make sure we received his message yesterday for ms tuckers metoprolol Please call when this is ready to be picked up

## 2011-06-05 NOTE — Telephone Encounter (Signed)
Patient informed. 

## 2011-06-10 ENCOUNTER — Encounter: Payer: Self-pay | Admitting: Internal Medicine

## 2011-06-10 ENCOUNTER — Ambulatory Visit (INDEPENDENT_AMBULATORY_CARE_PROVIDER_SITE_OTHER): Payer: Medicare Other | Admitting: Internal Medicine

## 2011-06-10 VITALS — BP 142/59 | HR 59 | Temp 97.7°F | Wt 93.0 lb

## 2011-06-10 DIAGNOSIS — I4891 Unspecified atrial fibrillation: Secondary | ICD-10-CM

## 2011-06-10 DIAGNOSIS — N3000 Acute cystitis without hematuria: Secondary | ICD-10-CM

## 2011-06-10 DIAGNOSIS — R300 Dysuria: Secondary | ICD-10-CM

## 2011-06-10 LAB — POCT URINALYSIS DIPSTICK
Ketones, UA: NEGATIVE
Leukocytes, UA: NEGATIVE
Protein, UA: NEGATIVE
Spec Grav, UA: 1.015
Urobilinogen, UA: 0.2
pH, UA: 7

## 2011-06-10 NOTE — Progress Notes (Signed)
Subjective:    Patient ID: Jenny Burns, female    DOB: Jun 30, 1916, 78 y.o.   MRN: [id number]  HPI 78YO female with h/o AFIB and chronic dysuria presents for follow up. She reports she is feeling well. She denies any complaints today. Denies dysuria, flank pain, fever, chills, fatigue.  Her son confirms that she has been doing well. She has been active and ambulating around her home with the assistance of a Railey Glad.  She has not had any recent falls. She denies shortness of breath, chest pain, or palpitations.  Outpatient Encounter Prescriptions as of 06/10/2011  Medication Sig Dispense Refill  . amLODipine (NORVASC) 5 MG tablet Take 5 mg by mouth daily.        Marland Kitchen aspirin 81 MG tablet Take 81 mg by mouth daily as needed.        Marland Kitchen atorvastatin (LIPITOR) 10 MG tablet Take 1 tablet (10 mg total) by mouth daily.  30 tablet  11  . Cholecalciferol (VITAMIN D3) 400 UNITS CAPS Take 1 tablet by mouth daily.        . digoxin (LANOXIN) 0.125 MG tablet Take 1 tablet (125 mcg total) by mouth daily.  30 tablet  6  . docusate sodium (DULCOLAX) 100 MG capsule Take 100 mg by mouth at bedtime.        . EVISTA 60 MG tablet TAKE ONE TABLET BY MOUTH EVERY DAY  90 each  0  . furosemide (LASIX) 20 MG tablet Take 1 tablet (20 mg total) by mouth daily as needed.  30 tablet  6  . isosorbide mononitrate (IMDUR) 30 MG 24 hr tablet Take 1 tablet (30 mg total) by mouth daily.  30 tablet  11  . levothyroxine (SYNTHROID, LEVOTHROID) 50 MCG tablet TAKE ONE TABLET BY MOUTH EVERY DAY  90 tablet  2  . lisinopril (PRINIVIL,ZESTRIL) 20 MG tablet Take 1 tablet (20 mg total) by mouth daily.  90 tablet  1  . metoprolol (LOPRESSOR) 100 MG tablet Take 0.5 tablets (50 mg total) by mouth 2 (two) times daily.  90 tablet  1  . Multiple Vitamin (MULTIVITAMINS PO) Take 1 tablet by mouth daily.        . vitamin B-12 (CYANOCOBALAMIN) 1000 MCG tablet Take 1,000 mcg by mouth daily.          Review of Systems  Constitutional: Negative for fever,  chills, appetite change, fatigue and unexpected weight change.  HENT: Negative for ear pain, congestion, sore throat, trouble swallowing, neck pain, voice change and sinus pressure.   Eyes: Negative for visual disturbance.  Respiratory: Negative for cough, shortness of breath, wheezing and stridor.   Cardiovascular: Negative for chest pain, palpitations and leg swelling.  Gastrointestinal: Negative for nausea, vomiting, abdominal pain, diarrhea, constipation, blood in stool, abdominal distention and anal bleeding.  Genitourinary: Negative for dysuria and flank pain.  Musculoskeletal: Negative for myalgias, arthralgias and gait problem.  Skin: Negative for color change and rash.  Neurological: Negative for dizziness and headaches.  Hematological: Negative for adenopathy. Does not bruise/bleed easily.  Psychiatric/Behavioral: Negative for suicidal ideas, sleep disturbance and dysphoric mood. The patient is not nervous/anxious.    BP 142/59  Pulse 59  Temp(Src) 97.7 F (36.5 C) (Oral)  SpO2 100%     Objective:   Physical Exam  Constitutional: She is oriented to person, place, and time. She appears well-developed and well-nourished. No distress.  HENT:  Head: Normocephalic and atraumatic.  Right Ear: External ear normal.  Left Ear: External  ear normal.  Nose: Nose normal.  Mouth/Throat: Oropharynx is clear and moist. No oropharyngeal exudate.  Eyes: Conjunctivae are normal. Pupils are equal, round, and reactive to light. Right eye exhibits no discharge. Left eye exhibits no discharge. No scleral icterus.  Neck: Normal range of motion. Neck supple. No tracheal deviation present. No thyromegaly present.  Cardiovascular: Normal rate, normal heart sounds and intact distal pulses.  An irregularly irregular rhythm present. Exam reveals no gallop and no friction rub.   No murmur heard. Pulmonary/Chest: Effort normal and breath sounds normal. No respiratory distress. She has no wheezes. She has  no rales. She exhibits no tenderness.  Musculoskeletal: Normal range of motion. She exhibits no edema and no tenderness.  Lymphadenopathy:    She has no cervical adenopathy.  Neurological: She is alert and oriented to person, place, and time. No cranial nerve deficit. She exhibits normal muscle tone. Coordination normal.  Skin: Skin is warm and dry. No rash noted. She is not diaphoretic. No erythema. No pallor.  Psychiatric: She has a normal mood and affect. Her behavior is normal. Judgment and thought content normal.          Assessment & Plan:

## 2011-06-10 NOTE — Assessment & Plan Note (Signed)
Currently asymptomatic, however pt would like to recheck urine culture to ensure that infection has cleared. Urinalysis remarkable for trace blood. Will send for culture. Pt son will bring another urine sample by in the future if any recurrent dysuria, fatigue, fever or other concerns.

## 2011-06-10 NOTE — Assessment & Plan Note (Signed)
Rate controlled. Not a candidate for anticoagulation because of risk of falls. Will continue current meds. 

## 2011-06-13 LAB — CULTURE, URINE COMPREHENSIVE

## 2011-06-15 ENCOUNTER — Telehealth: Payer: Self-pay | Admitting: *Deleted

## 2011-06-15 MED ORDER — CEPHALEXIN 500 MG PO CAPS: 500.0000 mg | ORAL_CAPSULE | Freq: Two times a day (BID) | ORAL | Status: AC

## 2011-06-15 NOTE — Telephone Encounter (Signed)
Patient informed. 

## 2011-06-15 NOTE — Telephone Encounter (Signed)
Message copied by Vernie Murders on Mon Jun 15, 2011  4:17 PM ------      Message from: Ronna Polio A      Created: Sat Jun 13, 2011  8:45 AM       Even though pt was asymptomatic, there was small amount of bacteria detected on urine culture.  I would like to be cautious and treat because of her history.  Keflex 500mg  po bid x 7 days.

## 2011-07-24 ENCOUNTER — Telehealth (INDEPENDENT_AMBULATORY_CARE_PROVIDER_SITE_OTHER): Payer: Medicare Other | Admitting: *Deleted

## 2011-07-24 DIAGNOSIS — N390 Urinary tract infection, site not specified: Secondary | ICD-10-CM

## 2011-07-24 LAB — POCT URINALYSIS DIPSTICK
Bilirubin, UA: NEGATIVE
Ketones, UA: NEGATIVE
Leukocytes, UA: NEGATIVE
Spec Grav, UA: 1.01

## 2011-07-24 NOTE — Telephone Encounter (Signed)
Family dropped off urine sample for patient. There are no signs of infection. I called and spoke with patient, she says that she is having some fatigue and the family thought she should have her urine checked out. Patient declined appointment. She says that she will call and schedule at another time. UA results are in the chart, also would you like for me to culture?

## 2011-07-24 NOTE — Telephone Encounter (Signed)
No, urine is normal

## 2011-08-11 ENCOUNTER — Encounter: Payer: Self-pay | Admitting: Cardiovascular Disease

## 2011-08-11 ENCOUNTER — Ambulatory Visit (INDEPENDENT_AMBULATORY_CARE_PROVIDER_SITE_OTHER): Payer: Medicare Other | Admitting: Cardiovascular Disease

## 2011-08-11 VITALS — BP 146/74 | HR 73 | Ht <= 58 in | Wt 90.0 lb

## 2011-08-11 DIAGNOSIS — I4891 Unspecified atrial fibrillation: Secondary | ICD-10-CM

## 2011-08-11 DIAGNOSIS — I10 Essential (primary) hypertension: Secondary | ICD-10-CM

## 2011-08-11 DIAGNOSIS — I739 Peripheral vascular disease, unspecified: Secondary | ICD-10-CM

## 2011-08-11 DIAGNOSIS — R5383 Other fatigue: Secondary | ICD-10-CM

## 2011-08-11 DIAGNOSIS — R5381 Other malaise: Secondary | ICD-10-CM

## 2011-08-11 DIAGNOSIS — R609 Edema, unspecified: Secondary | ICD-10-CM

## 2011-08-11 DIAGNOSIS — E785 Hyperlipidemia, unspecified: Secondary | ICD-10-CM

## 2011-08-11 NOTE — Assessment & Plan Note (Signed)
We have suggested she stay on her cholesterol medication 

## 2011-08-11 NOTE — Patient Instructions (Signed)
You are doing well. Please try  metoprolol twice a day (Am and PM)  Please call us if you have new issues that need to be addressed before your next appt.  Your physician wants you to follow-up in: 6 months.  You will receive a reminder letter in the mail two months in advance. If you don't receive a letter, please call our office to schedule the follow-up appointment.

## 2011-08-11 NOTE — Progress Notes (Signed)
Patient ID: Jenny Burns, female    DOB: 1916/06/25, 78 y.o.   MRN: [id number]  HPI Comments: 78 year old woman with a history of frequent falls, traumatic injury to her shoulder requiring surgery, also history of bladder and hysterectomy surgery in 2006 with long hospital course, recent onset of atrial fibrillation Not on anticoagulation as she is a high fall risk candidate, hypertension who presents for routine followup. She has a hx of edema.  She states that she no longer has visiting nurses and is not do physical therapy. She is able to use the walker to get around her house but is very weak in the legs and has chronic leg pain. Her last fall was one year ago. No recent falls. Her son lives with her and helps to take care of her. They are not interested in warfarin for atrial fibrillation. She denies any recent edema. She has not been taking Lasix for quite some time. She does have problems with constipation and does not drink a significant amount of fluids.   Her son reports that she does have periods of tachycardia. These are rare lasting for up to 30 minutes at a time. She takes only 50 mg of metoprolol in the morning alternating with 100 mg every other day.  She had lower extremity ABIs that showed significant disease bilaterally. Her feet are typically cold, weak, with purple discoloration. This was discussed with her again and she is not interested in further workup at this time  She has a history of back surgery after breaking her back in 2007.  EKG shows atrial fibrillation with rate of 74 beats per minute, no other significant ST or T wave changes    Outpatient Encounter Prescriptions as of 08/11/2011  Medication Sig Dispense Refill  . amLODipine (NORVASC) 5 MG tablet Take 5 mg by mouth daily.        Marland Kitchen aspirin 81 MG tablet Take 81 mg by mouth daily as needed.        Marland Kitchen atorvastatin (LIPITOR) 10 MG tablet Take 1 tablet (10 mg total) by mouth daily.  30 tablet  11  . Cholecalciferol  (VITAMIN D3) 400 UNITS CAPS Take 1 tablet by mouth daily.        . digoxin (LANOXIN) 0.125 MG tablet Take 1 tablet (125 mcg total) by mouth daily.  30 tablet  6  . docusate sodium (DULCOLAX) 100 MG capsule Take 100 mg by mouth at bedtime.        . EVISTA 60 MG tablet TAKE ONE TABLET BY MOUTH EVERY DAY  90 each  0  . furosemide (LASIX) 20 MG tablet Take 1 tablet (20 mg total) by mouth daily as needed.  30 tablet  6  . isosorbide mononitrate (IMDUR) 30 MG 24 hr tablet Take 1 tablet (30 mg total) by mouth daily.  30 tablet  11  . levothyroxine (SYNTHROID, LEVOTHROID) 50 MCG tablet TAKE ONE TABLET BY MOUTH EVERY DAY  90 tablet  2  . lisinopril (PRINIVIL,ZESTRIL) 20 MG tablet Take 1 tablet (20 mg total) by mouth daily.  90 tablet  1  . metoprolol (LOPRESSOR) 100 MG tablet Take 0.5 tablets (50 mg total) by mouth 2 (two) times daily.  90 tablet  1  . Multiple Vitamin (MULTIVITAMINS PO) Take 1 tablet by mouth daily.        . vitamin B-12 (CYANOCOBALAMIN) 1000 MCG tablet Take 1,000 mcg by mouth daily.          Review of  Systems  HENT: Negative.   Eyes: Negative.   Respiratory: Negative.   Cardiovascular: Negative.        Cold discolored feet bilaterally  Gastrointestinal: Negative.   Musculoskeletal: Positive for back pain, arthralgias and gait problem.  Skin: Negative.   Neurological: Positive for weakness.  Hematological: Negative.   Psychiatric/Behavioral: Negative.   All other systems reviewed and are negative.    BP 146/74  Pulse 73  Ht 4\' 10"  (1.473 m)  Wt 90 lb (40.824 kg)  BMI 18.81 kg/m2  Physical Exam  Nursing note and vitals reviewed. Constitutional: She is oriented to person, place, and time. She appears well-developed and well-nourished.       Sitting in a wheelchair, thin.  HENT:  Head: Normocephalic.  Nose: Nose normal.  Mouth/Throat: Oropharynx is clear and moist.  Eyes: Conjunctivae are normal. Pupils are equal, round, and reactive to light.  Neck: Normal range of  motion. Neck supple. No JVD present.  Cardiovascular: Normal rate, S1 normal, S2 normal and intact distal pulses.  An irregularly irregular rhythm present. Exam reveals decreased pulses. Exam reveals no gallop and no friction rub.   Murmur heard.  Crescendo systolic murmur is present with a grade of 1/6  Pulses:      Carotid pulses are 2+ on the right side, and 2+ on the left side.      Radial pulses are 2+ on the right side, and 2+ on the left side.       Dorsalis pedis pulses are 0 on the right side, and 0 on the left side.       Posterior tibial pulses are 0 on the right side, and 0 on the left side.  Pulmonary/Chest: Effort normal and breath sounds normal. No respiratory distress. She has no wheezes. She has no rales. She exhibits no tenderness.  Abdominal: Soft. Bowel sounds are normal. She exhibits no distension. There is no tenderness.  Musculoskeletal: Normal range of motion. She exhibits no edema and no tenderness.  Lymphadenopathy:    She has no cervical adenopathy.  Neurological: She is alert and oriented to person, place, and time. Coordination normal.  Skin: Skin is warm and dry. No rash noted. No erythema.  Psychiatric: She has a normal mood and affect. Her behavior is normal. Judgment and thought content normal.         Assessment and Plan

## 2011-08-11 NOTE — Assessment & Plan Note (Addendum)
We have suggested she take 50 mg of metoprolol twice a day rather than once a day. This could potentially help her periods of tachycardia. She's not interested in a Holter monitor at this time.  Unable to exclude other rhythms such as SVT. If needed, calcium channel blockers could be added for rhythm control.

## 2011-08-11 NOTE — Assessment & Plan Note (Signed)
Blood pressure is well controlled on today's visit. We have suggested she take metoprolol twice a day.

## 2011-08-11 NOTE — Assessment & Plan Note (Signed)
Previous ABI suggesting severe lower extremity disease. She's not interested in further workup.

## 2011-08-27 ENCOUNTER — Other Ambulatory Visit: Payer: Self-pay | Admitting: Family Medicine

## 2011-09-10 ENCOUNTER — Ambulatory Visit (INDEPENDENT_AMBULATORY_CARE_PROVIDER_SITE_OTHER): Payer: Medicare Other | Admitting: Internal Medicine

## 2011-09-10 ENCOUNTER — Encounter: Payer: Self-pay | Admitting: Internal Medicine

## 2011-09-10 VITALS — BP 140/66 | HR 54 | Temp 98.3°F | Resp 14 | Wt 90.0 lb

## 2011-09-10 DIAGNOSIS — R5381 Other malaise: Secondary | ICD-10-CM

## 2011-09-10 DIAGNOSIS — N390 Urinary tract infection, site not specified: Secondary | ICD-10-CM

## 2011-09-10 DIAGNOSIS — D510 Vitamin B12 deficiency anemia due to intrinsic factor deficiency: Secondary | ICD-10-CM

## 2011-09-10 DIAGNOSIS — R0609 Other forms of dyspnea: Secondary | ICD-10-CM

## 2011-09-10 DIAGNOSIS — I4891 Unspecified atrial fibrillation: Secondary | ICD-10-CM

## 2011-09-10 DIAGNOSIS — R5383 Other fatigue: Secondary | ICD-10-CM

## 2011-09-10 DIAGNOSIS — E039 Hypothyroidism, unspecified: Secondary | ICD-10-CM

## 2011-09-10 LAB — POCT URINALYSIS DIPSTICK
Ketones, UA: NEGATIVE
Leukocytes, UA: NEGATIVE
Protein, UA: NEGATIVE
Urobilinogen, UA: 0.2
pH, UA: 7

## 2011-09-11 ENCOUNTER — Encounter: Payer: Self-pay | Admitting: Cardiovascular Disease

## 2011-09-11 ENCOUNTER — Encounter (INDEPENDENT_AMBULATORY_CARE_PROVIDER_SITE_OTHER): Payer: Medicare Other

## 2011-09-11 ENCOUNTER — Ambulatory Visit (INDEPENDENT_AMBULATORY_CARE_PROVIDER_SITE_OTHER): Payer: Medicare Other | Admitting: Cardiovascular Disease

## 2011-09-11 VITALS — BP 118/48 | HR 56 | Ht <= 58 in | Wt 87.0 lb

## 2011-09-11 DIAGNOSIS — R5383 Other fatigue: Secondary | ICD-10-CM | POA: Insufficient documentation

## 2011-09-11 DIAGNOSIS — R0989 Other specified symptoms and signs involving the circulatory and respiratory systems: Secondary | ICD-10-CM

## 2011-09-11 DIAGNOSIS — I4891 Unspecified atrial fibrillation: Secondary | ICD-10-CM

## 2011-09-11 DIAGNOSIS — R0609 Other forms of dyspnea: Secondary | ICD-10-CM

## 2011-09-11 LAB — COMPREHENSIVE METABOLIC PANEL
ALT: 17 U/L (ref 0–35)
Albumin: 4 g/dL (ref 3.5–5.2)
CO2: 27 mEq/L (ref 19–32)
Calcium: 8.9 mg/dL (ref 8.4–10.5)
Chloride: 103 mEq/L (ref 96–112)
GFR: 66.35 mL/min (ref >60.00)
Glucose, Bld: 83 mg/dL (ref 70–99)
Sodium: 137 mEq/L (ref 135–145)
Total Protein: 6.1 g/dL (ref 6.0–8.3)

## 2011-09-11 LAB — CBC WITH DIFFERENTIAL/PLATELET
Basophils Absolute: 0 10*3/uL (ref 0.0–0.1)
Eosinophils Relative: 3.1 % (ref 0.0–5.0)
Lymphocytes Relative: 30.2 % (ref 12.0–46.0)
Lymphs Abs: 1.2 10*3/uL (ref 0.7–4.0)
Monocytes Relative: 8.1 % (ref 3.0–12.0)
Neutrophils Relative %: 58 % (ref 43.0–77.0)
Platelets: 147 10*3/uL — ABNORMAL LOW (ref 150.0–400.0)
RDW: 14.2 % (ref 11.5–14.6)
WBC: 4 10*3/uL — ABNORMAL LOW (ref 4.5–10.5)

## 2011-09-11 LAB — TSH: TSH: 1.68 u[IU]/mL (ref 0.35–5.50)

## 2011-09-11 LAB — VITAMIN B12: Vitamin B-12: >1500 pg/mL — ABNORMAL HIGH (ref 211–911)

## 2011-09-11 MED ORDER — METOPROLOL TARTRATE 25 MG PO TABS: 25.0000 mg | ORAL_TABLET | Freq: Two times a day (BID) | ORAL | Status: DC

## 2011-09-11 NOTE — Assessment & Plan Note (Signed)
As above, suspect related to bradycardia. However, will check CBC, CMP, TSH, B12. Patient will followup in one week. She will see cardiology tomorrow.

## 2011-09-11 NOTE — Assessment & Plan Note (Signed)
Historically has been rate controlled with metoprolol. However, now having some bradycardia. Question if she might benefit from Holter monitor. Followup with cardiology scheduled for tomorrow.

## 2011-09-11 NOTE — Assessment & Plan Note (Signed)
The patient has chronic atrial fibrillation currently being treated with rate control with small dose digoxin as well as metoprolol. She is currently bradycardic which could be the reason for her dyspnea and fatigue. Thus, I recommend decreasing the dose of metoprolol to 25 mg twice daily. She had routine labs performed which overall were unremarkable. The digoxin level is still pending but I doubt that it will be elevated given that she is on a small dose and has normal renal function. Given that she reports symptoms of occasional tachycardia, I will obtain a 48-hour Holter monitor to evaluate her rate control and rule out any other significant bradycardia arrhythmia.

## 2011-09-11 NOTE — Assessment & Plan Note (Signed)
Patient complains of progressive dyspnea on exertion. Her exam today is markable for bradycardia with frequent pauses. Question if bradycardia may be causing dyspnea on exertion. Question if she would benefit from Holter monitoring. Will set up a followup with cardiology tomorrow. Pulmonary exam is normal. There are no symptoms to suggest pulmonary infection. No findings to suggest congestive heart failure. Will check basic lab work today including CBC, CMP, TSH, and B12.

## 2011-09-11 NOTE — Patient Instructions (Signed)
Decrease Metoprolol to 25 mg twice daily   Your physician has recommended that you wear a holter monitor. Holter monitors are medical devices that record the heart's electrical activity. Doctors most often use these monitors to diagnose arrhythmias. Arrhythmias are problems with the speed or rhythm of the heartbeat. The monitor is a small, portable device. You can wear one while you do your normal daily activities. This is usually used to diagnose what is causing palpitations/syncope (passing out).  Follow up with Dr. Mariah Milling after the monitor.

## 2011-09-11 NOTE — Assessment & Plan Note (Addendum)
Could be related to bradycardia. I recommend reevaluating this after bradycardia improves. If this does not improve, consider an echocardiogram. I will have her followup with Dr. Mariah Milling after the Holter monitor.

## 2011-09-11 NOTE — Progress Notes (Signed)
Subjective:    Patient ID: Jenny Burns, female    DOB: 1917/02/24, 78 y.o.   MRN: [id number]  HPI 78 year old female with history of atrial fibrillation presents for acute visit complaining of fatigue and dyspnea on exertion. Her son reports that even with minimal exertion such as walking across the room, she becomes very short of breath. She denies any chest pain, palpitations. She denies any fever, chills, dysuria, change in bowel habits, change in appetite. Dyspnea on exertion has been present for a couple of weeks and is gradually getting worse.  Outpatient Encounter Prescriptions as of 09/10/2011  Medication Sig Dispense Refill  . amLODipine (NORVASC) 5 MG tablet Take 5 mg by mouth 2 (two) times daily.       Marland Kitchen atorvastatin (LIPITOR) 10 MG tablet Take 1 tablet (10 mg total) by mouth daily.  30 tablet  11  . Cholecalciferol (VITAMIN D3) 400 UNITS CAPS Take 1 tablet by mouth daily.        . digoxin (LANOXIN) 0.125 MG tablet Take 1 tablet (125 mcg total) by mouth daily.  30 tablet  6  . docusate sodium (DULCOLAX) 100 MG capsule Take 100 mg by mouth at bedtime.        . Ferrous Sulfate (IRON) 325 (65 FE) MG TABS Take by mouth.      . isosorbide mononitrate (IMDUR) 30 MG 24 hr tablet Take 1 tablet (30 mg total) by mouth daily.  30 tablet  11  . levothyroxine (SYNTHROID, LEVOTHROID) 50 MCG tablet TAKE ONE TABLET BY MOUTH EVERY DAY  90 tablet  3  . lisinopril (PRINIVIL,ZESTRIL) 20 MG tablet Take 1 tablet (20 mg total) by mouth daily.  90 tablet  1  . metoprolol (LOPRESSOR) 100 MG tablet Take 0.5 tablets (50 mg total) by mouth 2 (two) times daily.  90 tablet  1  . Multiple Vitamin (MULTIVITAMINS PO) Take 1 tablet by mouth daily.        Marland Kitchen POTASSIUM GLUCONATE PO Take by mouth.      . vitamin B-12 (CYANOCOBALAMIN) 1000 MCG tablet Take 1,000 mcg by mouth daily.          Review of Systems  Constitutional: Positive for fatigue. Negative for fever, chills, appetite change and unexpected weight change.   HENT: Negative for ear pain, congestion, sore throat, trouble swallowing, neck pain, voice change and sinus pressure.   Eyes: Negative for visual disturbance.  Respiratory: Positive for shortness of breath. Negative for cough, wheezing and stridor.   Cardiovascular: Negative for chest pain, palpitations and leg swelling.  Gastrointestinal: Negative for nausea, vomiting, abdominal pain, diarrhea, constipation, blood in stool, abdominal distention and anal bleeding.  Genitourinary: Negative for dysuria, urgency, frequency, flank pain and pelvic pain.  Musculoskeletal: Negative for myalgias, arthralgias and gait problem.  Skin: Negative for color change and rash.  Neurological: Negative for dizziness and headaches.  Hematological: Negative for adenopathy. Does not bruise/bleed easily.  Psychiatric/Behavioral: Negative for suicidal ideas, sleep disturbance and dysphoric mood. The patient is not nervous/anxious.    BP 140/66  Pulse 54  Temp(Src) 98.3 F (36.8 C) (Oral)  Resp 14  Wt 90 lb (40.824 kg)  SpO2 95%     Objective:   Physical Exam  Constitutional: She is oriented to person, place, and time. She appears well-developed and well-nourished. No distress.  HENT:  Head: Normocephalic and atraumatic.  Right Ear: External ear normal.  Left Ear: External ear normal.  Nose: Nose normal.  Mouth/Throat: Oropharynx is clear and  moist. No oropharyngeal exudate.  Eyes: Conjunctivae are normal. Pupils are equal, round, and reactive to light. Right eye exhibits no discharge. Left eye exhibits no discharge. No scleral icterus.  Neck: Normal range of motion. Neck supple. No tracheal deviation present. No thyromegaly present.  Cardiovascular: Normal heart sounds and intact distal pulses.  An irregularly irregular rhythm present. Bradycardia present.  Exam reveals no gallop and no friction rub.   No murmur heard.      Frequent pause  Pulmonary/Chest: Effort normal and breath sounds normal. No  respiratory distress. She has no wheezes. She has no rales. She exhibits no tenderness.  Musculoskeletal: Normal range of motion. She exhibits no edema and no tenderness.  Lymphadenopathy:    She has no cervical adenopathy.  Neurological: She is alert and oriented to person, place, and time. No cranial nerve deficit. She exhibits normal muscle tone. Coordination normal.  Skin: Skin is warm and dry. No rash noted. She is not diaphoretic. No erythema. No pallor.  Psychiatric: She has a normal mood and affect. Her behavior is normal. Judgment and thought content normal.          Assessment & Plan:

## 2011-09-11 NOTE — Progress Notes (Signed)
HPI  78 year old woman a patient of Dr. Mariah Milling who is being seen urgently due to bradycardia. She was seen yesterday by Dr. Dan Humphreys. She has  a history of frequent falls, traumatic injury to her shoulder requiring surgery, also history of bladder and hysterectomy surgery in 2006 with long hospital course. She has a history of chronic atrial fibrillation not on anticoagulation. She was seen back in April. At that time she was asked to change metoprolol to 50 mg twice daily instead of 100 mg once daily. There was really no increase in her rate controlling medications.  She she has been having increased fatigue and dyspnea. She was noted to be bradycardic yesterday with mild pauses. There has been no syncope or presyncope but the patient does feel dizzy. She occasionally have tachycardia.  Allergies  Allergen Reactions  . Codeine     REACTION: nausea  . Sulfonamide Derivatives     REACTION: unspecified     Current Outpatient Prescriptions on File Prior to Visit  Medication Sig Dispense Refill  . amLODipine (NORVASC) 5 MG tablet Take 5 mg by mouth daily.       Marland Kitchen atorvastatin (LIPITOR) 10 MG tablet Take 1 tablet (10 mg total) by mouth daily.  30 tablet  11  . Cholecalciferol (VITAMIN D3) 400 UNITS CAPS Take 1 tablet by mouth daily.        . digoxin (LANOXIN) 0.125 MG tablet Take 1 tablet (125 mcg total) by mouth daily.  30 tablet  6  . docusate sodium (DULCOLAX) 100 MG capsule Take 100 mg by mouth at bedtime.        . Ferrous Sulfate (IRON) 325 (65 FE) MG TABS Take by mouth.      . isosorbide mononitrate (IMDUR) 30 MG 24 hr tablet Take 1 tablet (30 mg total) by mouth daily.  30 tablet  11  . levothyroxine (SYNTHROID, LEVOTHROID) 50 MCG tablet TAKE ONE TABLET BY MOUTH EVERY DAY  90 tablet  3  . lisinopril (PRINIVIL,ZESTRIL) 20 MG tablet Take 1 tablet (20 mg total) by mouth daily.  90 tablet  1  . Multiple Vitamin (MULTIVITAMINS PO) Take 1 tablet by mouth daily.        Marland Kitchen POTASSIUM GLUCONATE  PO Take by mouth.      . vitamin B-12 (CYANOCOBALAMIN) 1000 MCG tablet Take 1,000 mcg by mouth daily.        Marland Kitchen DISCONTD: metoprolol (LOPRESSOR) 100 MG tablet Take 0.5 tablets (50 mg total) by mouth 2 (two) times daily.  90 tablet  1     Past Medical History  Diagnosis Date  . Diabetes mellitus   . Osteoporosis 05/1997  . Compression fracture of thoracic vertebra 05/2007    T11, right sacral fracture inf pubic ramus fx  . AF (atrial fibrillation)     2012  . Hyperlipidemia 04/1995  . Hypertension 1970s  . Hypothyroidism 1994     Past Surgical History  Procedure Date  . Btl   . Tumor removal 1950    shoulder  . Skin cancer excision 1980    Scalp  . Tumor of submand gland 1970  . Fracture surgery     multiple  . Doppler echocardiography 06/13/2003    nml EF 60% Mild LAE Mod MR Mild AR, TR  . Persantine cardiolite 12/01/2004    Poss Inf Scar neg ischemia EF 89%  . Total abdominal hysterectomy 11/2006    [facility] - Stress Incont, [facility]  . Right shoulder replacement 09/27/2003  [facility] - Dr. Ernest Pine  . Ct head w/  and w/o 02/24/2006     Large subgleal hematoma; L superficial scal hematoma  . Ct spine 12/2006    L/S Compr fx L2  . Doppler echocardiography 05/23/07    Normal, LVF EF 65%, carotid dopplers 60-80% Bilat ICA; MRI brain nml / Abd & pelvis fxs.  . Mri head and brain 05/23/07      No acute changes, mild ASCVD  . Carotid US 05/23/07    LICA 60-79% RICA 40-59% (Dr. Arbie Cookey)  . Ct head limited w/o cm 06/28/08    [facility] - R Parietal soft tissue swelling, no acute changes  . Dexa 05/1999    Spine -2.82; Hip -2.98     Family History  Problem Relation Age of Onset  . Angina Mother      History   Social History  . Marital Status: Widowed    Spouse Name: N/A    Number of Children: 9  . Years of Education: N/A   Occupational History  . Housewife    Social History Main Topics  . Smoking status: Never Smoker   . Smokeless tobacco: Never Used  . Alcohol Use: No  . Drug  Use: No  . Sexually Active: Not on file   Other Topics Concern  . Not on file   Social History Narrative   Married, widowed, husband died of MI in hospital [id number] children     PHYSICAL EXAM   BP 118/48  Pulse 56  Ht 4\' 10"  (1.473 m)  Wt 87 lb (39.463 kg)  BMI 18.18 kg/m2  Constitutional: She is oriented to person, place, and time. She appears frail. No distress.  HENT: No nasal discharge.  Head: Normocephalic and atraumatic.  Eyes: Pupils are equal and round. Right eye exhibits no discharge. Left eye exhibits no discharge.  Neck: Normal range of motion. Neck supple. No JVD present. No thyromegaly present.  Cardiovascular: Normal rate, irregular rhythm, normal heart sounds. Exam reveals no gallop and no friction rub. No murmur heard.  Pulmonary/Chest: Effort normal and breath sounds normal. No stridor. No respiratory distress. She has no wheezes. She has no rales. She exhibits no tenderness.  Abdominal: Soft. Bowel sounds are normal. She exhibits no distension. There is no tenderness. There is no rebound and no guarding.  Musculoskeletal: Normal range of motion. She exhibits no edema and no tenderness.  Neurological: She is alert and oriented to person, place, and time. Coordination normal.  Skin: Skin is warm and dry. No rash noted. She is not diaphoretic. No erythema. No pallor.  Psychiatric: She has a normal mood and affect. Her behavior is normal. Judgment and thought content normal.    EKG: Atrial fibrillation  -Old anteroseptal infarct.   Low voltage -possible pulmonary disease.    ASSESSMENT AND PLAN

## 2011-09-12 LAB — DIGOXIN LEVEL: Digoxin Level: 1.1 ng/mL (ref 0.8–2.0)

## 2011-09-14 ENCOUNTER — Other Ambulatory Visit: Payer: Self-pay | Admitting: *Deleted

## 2011-09-14 MED ORDER — CEPHALEXIN 500 MG PO CAPS: 500.0000 mg | ORAL_CAPSULE | Freq: Four times a day (QID) | ORAL | Status: AC

## 2011-09-15 ENCOUNTER — Other Ambulatory Visit: Payer: Self-pay

## 2011-09-15 ENCOUNTER — Telehealth: Payer: Self-pay

## 2011-09-15 NOTE — Telephone Encounter (Signed)
Rec'd pt.'s holter monitor results. Reviewed by Dr. Elease Hashimoto. Some pauses and bradycardia were noted on holter. He gave order to stop metoprolol.  I called pt to tell her this.  She verb. Understanding and will keep f/u appt 6/17 with Dr. Mariah Milling.

## 2011-09-28 ENCOUNTER — Encounter: Payer: Self-pay | Admitting: Cardiovascular Disease

## 2011-09-28 ENCOUNTER — Ambulatory Visit (INDEPENDENT_AMBULATORY_CARE_PROVIDER_SITE_OTHER): Payer: Medicare Other | Admitting: Cardiovascular Disease

## 2011-09-28 VITALS — BP 130/60 | HR 79 | Ht <= 58 in | Wt 87.8 lb

## 2011-09-28 DIAGNOSIS — R0602 Shortness of breath: Secondary | ICD-10-CM

## 2011-09-28 DIAGNOSIS — I10 Essential (primary) hypertension: Secondary | ICD-10-CM

## 2011-09-28 DIAGNOSIS — I4891 Unspecified atrial fibrillation: Secondary | ICD-10-CM

## 2011-09-28 DIAGNOSIS — E785 Hyperlipidemia, unspecified: Secondary | ICD-10-CM

## 2011-09-28 DIAGNOSIS — I739 Peripheral vascular disease, unspecified: Secondary | ICD-10-CM

## 2011-09-28 MED ORDER — ATORVASTATIN CALCIUM 10 MG PO TABS: 10.0000 mg | ORAL_TABLET | Freq: Every day | ORAL | Status: DC

## 2011-09-28 MED ORDER — ISOSORBIDE MONONITRATE ER 30 MG PO TB24: 30.0000 mg | ORAL_TABLET | Freq: Every day | ORAL | Status: DC

## 2011-09-28 NOTE — Patient Instructions (Addendum)
You are doing well. No medication changes were made.  Please call us if you have new issues that need to be addressed before your next appt.  Your physician wants you to follow-up in: 6 months.  You will receive a reminder letter in the mail two months in advance. If you don't receive a letter, please call our office to schedule the follow-up appointment.   

## 2011-09-28 NOTE — Assessment & Plan Note (Signed)
Continue Lipitor. Goal LDL less than 70 

## 2011-09-28 NOTE — Assessment & Plan Note (Signed)
Severe PVD the lower extremities. She does not want further workup.

## 2011-09-28 NOTE — Assessment & Plan Note (Signed)
Blood pressure is well controlled on today's visit. No changes made to the medications. 

## 2011-09-28 NOTE — Assessment & Plan Note (Signed)
Rate controlled on digoxin. Metoprolol has been held secondary to bradycardia. Not on anticoagulation as she is a high fall risk.

## 2011-09-28 NOTE — Progress Notes (Signed)
Patient ID: Jenny Burns, female    DOB: 07/16/1916, 78 y.o.   MRN: [id number]  HPI Comments: 78 year old woman with a history of frequent falls, traumatic injury to her shoulder requiring surgery, also history of bladder and hysterectomy surgery in 2006 with long hospital course, recent onset of atrial fibrillation Not on anticoagulation as she is a high fall risk candidate, hypertension who presents for routine followup. She has a hx of edema.  She is seen in followup after bradycardia was noted on office visit and Holter monitor. Metoprolol was decreased to 25 mg twice a day when she saw Dr. Kirke Corin. After Holter monitor, metoprolol was held. She denies any significant lightheadedness or dizziness. Her son reports blood pressure and heart rate is within a reasonable range. She did have one episode of low blood pressure after she spent all day at a party. Since then blood pressure has been well controlled, not too low. She continues to have significant limitations secondary to back pain, leg pain.  She had lower extremity ABIs that showed significant disease bilaterally. Her feet are typically cold, weak, with purple discoloration. This was discussed with her again and she is not interested in further workup at this time  She has a history of back surgery after breaking her back in 2007.  EKG shows atrial fibrillation with rate of 79 beats per minute, no other significant ST or T wave changes    Outpatient Encounter Prescriptions as of 09/28/2011  Medication Sig Dispense Refill  . amLODipine (NORVASC) 5 MG tablet Take 5 mg by mouth 2 (two) times daily.       Marland Kitchen atorvastatin (LIPITOR) 10 MG tablet Take 1 tablet (10 mg total) by mouth daily.  90 tablet  3  . cephALEXin (KEFLEX) 500 MG capsule Take 500 mg by mouth 4 (four) times daily.      . Cholecalciferol (VITAMIN D3) 400 UNITS CAPS Take 1 tablet by mouth daily.        . cyanocobalamin 2000 MCG tablet Take 2,000 mcg by mouth daily.      .  digoxin (LANOXIN) 0.125 MG tablet Take 1 tablet (125 mcg total) by mouth daily.  30 tablet  6  . docusate sodium (DULCOLAX) 100 MG capsule Take 100 mg by mouth as needed.       . Ferrous Sulfate (IRON) 325 (65 FE) MG TABS Take by mouth.      . isosorbide mononitrate (IMDUR) 30 MG 24 hr tablet Take 1 tablet (30 mg total) by mouth daily.  90 tablet  3  . levothyroxine (SYNTHROID, LEVOTHROID) 50 MCG tablet TAKE ONE TABLET BY MOUTH EVERY DAY  90 tablet  3  . lisinopril (PRINIVIL,ZESTRIL) 20 MG tablet Take 1 tablet (20 mg total) by mouth daily.  90 tablet  1  . Multiple Vitamin (MULTIVITAMINS PO) Take 1 tablet by mouth daily.        Marland Kitchen POTASSIUM GLUCONATE PO Take by mouth.        Review of Systems  HENT: Negative.   Eyes: Negative.   Respiratory: Negative.   Cardiovascular: Negative.        Cold discolored feet bilaterally  Gastrointestinal: Negative.   Musculoskeletal: Positive for back pain, arthralgias and gait problem.  Skin: Negative.   Neurological: Positive for weakness.  Hematological: Negative.   Psychiatric/Behavioral: Negative.   All other systems reviewed and are negative.    BP 130/60  Pulse 79  Ht 4\' 10"  (1.473 m)  Wt 87 lb 12  oz (39.803 kg)  BMI 18.34 kg/m2  Physical Exam  Nursing note and vitals reviewed. Constitutional: She is oriented to person, place, and time. She appears well-developed and well-nourished.       Sitting in a wheelchair, thin.  HENT:  Head: Normocephalic.  Nose: Nose normal.  Mouth/Throat: Oropharynx is clear and moist.  Eyes: Conjunctivae are normal. Pupils are equal, round, and reactive to light.  Neck: Normal range of motion. Neck supple. No JVD present.  Cardiovascular: Normal rate, S1 normal, S2 normal and intact distal pulses.  An irregularly irregular rhythm present. Exam reveals decreased pulses. Exam reveals no gallop and no friction rub.   Murmur heard.  Crescendo systolic murmur is present with a grade of 1/6  Pulses:      Carotid  pulses are 2+ on the right side, and 2+ on the left side.      Radial pulses are 2+ on the right side, and 2+ on the left side.       Dorsalis pedis pulses are 0 on the right side, and 0 on the left side.       Posterior tibial pulses are 0 on the right side, and 0 on the left side.  Pulmonary/Chest: Effort normal and breath sounds normal. No respiratory distress. She has no wheezes. She has no rales. She exhibits no tenderness.  Abdominal: Soft. Bowel sounds are normal. She exhibits no distension. There is no tenderness.  Musculoskeletal: Normal range of motion. She exhibits no edema and no tenderness.  Lymphadenopathy:    She has no cervical adenopathy.  Neurological: She is alert and oriented to person, place, and time. Coordination normal.  Skin: Skin is warm and dry. No rash noted. No erythema.  Psychiatric: She has a normal mood and affect. Her behavior is normal. Judgment and thought content normal.         Assessment and Plan

## 2011-10-09 ENCOUNTER — Telehealth: Payer: Self-pay | Admitting: Cardiovascular Disease

## 2011-10-09 MED ORDER — AMLODIPINE BESYLATE 5 MG PO TABS: 5.0000 mg | ORAL_TABLET | Freq: Every day | ORAL | Status: DC

## 2011-10-09 NOTE — Telephone Encounter (Signed)
Would agree with 1/2 dose amlodipine They need to get meds organized

## 2011-10-09 NOTE — Telephone Encounter (Signed)
Loren Racer pt daughter in law is taking care of pt while son is on vacation and has MANY question about medications. Not sure what she is to be really taking

## 2011-10-09 NOTE — Telephone Encounter (Signed)
See what I did...complicated, so let me know if you have questions!  I called Linda back.  She says pt's caregiver "never showed up" and is unsure if he will return.  He managed pt's meds, etc.  Son/dtr in law are having to do this now and dtr-in-law is trying to fix pill boxes for the week.   She was comparing med list from most recent OV with [place] to meds/bottles at home.  All meds at home matched med list except amlodipine. She had empty bottle at pt home and wonders if pt was to be taking this.  After reviewing chart, it seems Dr. Mariah Milling did not want to change amlodipine/stop med.  He assumed she was on this.  It may be that when pill box was last filled, that emptied the bottle and she just needs refill.  BP today is 130 systolic.    Since Dr. Mariah Milling thought pt was on this med (based on office note), I advised dtr-in-law to have pt continue taking amlodipine 5 mg, but instead of BID, take daily, in case she had not been taking it all along. She will monitor pt's BP over w/e and if it becomes too low on the amlodipine 5, she can hold it and call us Monday.  I am sending in refill to pharm.  Dtr-in-law in agreeable to this plan.

## 2011-10-27 ENCOUNTER — Other Ambulatory Visit: Payer: Self-pay | Admitting: Internal Medicine

## 2011-11-13 ENCOUNTER — Other Ambulatory Visit (INDEPENDENT_AMBULATORY_CARE_PROVIDER_SITE_OTHER): Payer: Medicare Other | Admitting: *Deleted

## 2011-11-13 ENCOUNTER — Other Ambulatory Visit: Payer: Self-pay | Admitting: *Deleted

## 2011-11-13 DIAGNOSIS — N390 Urinary tract infection, site not specified: Secondary | ICD-10-CM

## 2011-11-13 LAB — POCT URINALYSIS DIPSTICK
Bilirubin, UA: NEGATIVE
Glucose, UA: NEGATIVE
Nitrite, UA: NEGATIVE
Spec Grav, UA: 1.01

## 2011-11-13 MED ORDER — CIPROFLOXACIN HCL 250 MG PO TABS: 250.0000 mg | ORAL_TABLET | Freq: Two times a day (BID) | ORAL | Status: AC

## 2011-11-15 LAB — URINE CULTURE

## 2011-11-23 ENCOUNTER — Telehealth: Payer: Self-pay | Admitting: *Deleted

## 2011-11-23 NOTE — Telephone Encounter (Signed)
Patients spouse advised as instructed, appt scheduled for 12/01/2011.

## 2011-11-23 NOTE — Telephone Encounter (Signed)
Lets see her within next 1-2 weeks.

## 2011-11-23 NOTE — Telephone Encounter (Signed)
Patients spouse called stating that Kwana finished her antibiotics and didn't know if Dr. Dan Humphreys needed to see her.  She has no upcoming appts.  Please advise.

## 2011-12-01 ENCOUNTER — Other Ambulatory Visit (HOSPITAL_COMMUNITY)
Admission: RE | Admit: 2011-12-01 | Discharge: 2011-12-01 | Disposition: A | Discharge status: Home / Self Care | Payer: Medicare Other | Source: Ambulatory Visit | Attending: Internal Medicine | Admitting: Internal Medicine

## 2011-12-01 ENCOUNTER — Ambulatory Visit (INDEPENDENT_AMBULATORY_CARE_PROVIDER_SITE_OTHER): Payer: Medicare Other | Admitting: Internal Medicine

## 2011-12-01 ENCOUNTER — Encounter: Payer: Self-pay | Admitting: Internal Medicine

## 2011-12-01 VITALS — BP 130/80 | HR 54 | Temp 98.1°F | Wt 89.0 lb

## 2011-12-01 DIAGNOSIS — R319 Hematuria, unspecified: Secondary | ICD-10-CM | POA: Insufficient documentation

## 2011-12-01 DIAGNOSIS — R202 Paresthesia of skin: Secondary | ICD-10-CM | POA: Insufficient documentation

## 2011-12-01 DIAGNOSIS — R300 Dysuria: Secondary | ICD-10-CM

## 2011-12-01 DIAGNOSIS — R209 Unspecified disturbances of skin sensation: Secondary | ICD-10-CM

## 2011-12-01 DIAGNOSIS — E039 Hypothyroidism, unspecified: Secondary | ICD-10-CM

## 2011-12-01 DIAGNOSIS — D510 Vitamin B12 deficiency anemia due to intrinsic factor deficiency: Secondary | ICD-10-CM

## 2011-12-01 LAB — CBC WITH DIFFERENTIAL/PLATELET
Basophils Absolute: 0 10*3/uL (ref 0.0–0.1)
Basophils Relative: 0.7 % (ref 0.0–3.0)
Eosinophils Absolute: 0.1 10*3/uL (ref 0.0–0.7)
HCT: 40.4 % (ref 36.0–46.0)
Hemoglobin: 13.3 g/dL (ref 12.0–15.0)
Lymphs Abs: 0.9 10*3/uL (ref 0.7–4.0)
MCHC: 33 g/dL (ref 30.0–36.0)
MCV: 95.3 fl (ref 78.0–100.0)
Monocytes Absolute: 0.3 10*3/uL (ref 0.1–1.0)
Neutro Abs: 2.9 10*3/uL (ref 1.4–7.7)
RDW: 13.9 % (ref 11.5–14.6)

## 2011-12-01 LAB — COMPREHENSIVE METABOLIC PANEL
ALT: 12 U/L (ref 0–35)
AST: 18 U/L (ref 0–37)
Alkaline Phosphatase: 75 U/L (ref 39–117)
Calcium: 9.3 mg/dL (ref 8.4–10.5)
Chloride: 99 mEq/L (ref 96–112)
Creatinine, Ser: 0.6 mg/dL (ref 0.4–1.2)
Total Bilirubin: 0.7 mg/dL (ref 0.3–1.2)

## 2011-12-01 LAB — POCT URINALYSIS DIPSTICK
Bilirubin, UA: NEGATIVE
Leukocytes, UA: NEGATIVE
Nitrite, UA: NEGATIVE
Urobilinogen, UA: 0.2
pH, UA: 7.5

## 2011-12-01 LAB — TSH: TSH: 1.36 u[IU]/mL (ref 0.35–5.50)

## 2011-12-01 NOTE — Assessment & Plan Note (Signed)
Hematuria persistent. Will resend urine for culture and also check cytology. If no infection identified, then will plan to set up urology evaluation for possible cystoscopy.

## 2011-12-01 NOTE — Assessment & Plan Note (Signed)
Intermittent lower extremity edema and some tingling in lower legs. 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 B12 and TSH normal on labs today. Electrolytes normal. Symptoms mild, so will monitor for now.

## 2011-12-01 NOTE — Progress Notes (Signed)
Subjective:    Patient ID: Jenny Burns, female    DOB: 12-10-1916, 78 y.o.   MRN: [id number]  HPI 78YO female presents for follow up after recent UTI. Denies any symptoms of dysuria, urinary frequency, hematuria, fever, chills, flank pain. Generally feeling well, however yesterday had episode of swelling in her ankles, and some weakness with standing. This has now resolved. Reports mild fatigue, but no focal symptoms such as chest pain, dyspnea.  Has chronic pain in hips, back, shoulders from arthritis and previous surgeries. Reports appetite is good. Notes some constipation for which she uses Ducolax. Recently, constipation seems worse. Last BM several days ago. Denies blood in stool, rectal pain, abdominal pain.  Outpatient Encounter Prescriptions as of 12/01/2011  Medication Sig Dispense Refill  . amLODipine (NORVASC) 5 MG tablet Take 1 tablet (5 mg total) by mouth daily.  90 tablet  3  . aspirin 81 MG tablet Take 81 mg by mouth daily.      Marland Kitchen atorvastatin (LIPITOR) 10 MG tablet Take 1 tablet (10 mg total) by mouth daily.  90 tablet  3  . Cholecalciferol (VITAMIN D3) 400 UNITS CAPS Take 1 tablet by mouth daily.        . cyanocobalamin 2000 MCG tablet Take 2,000 mcg by mouth daily.      . digoxin (LANOXIN) 0.125 MG tablet Take 1 tablet (125 mcg total) by mouth daily.  30 tablet  6  . docusate sodium (DULCOLAX) 100 MG capsule Take 100 mg by mouth as needed.       . isosorbide mononitrate (IMDUR) 30 MG 24 hr tablet Take 1 tablet (30 mg total) by mouth daily.  90 tablet  3  . levothyroxine (SYNTHROID, LEVOTHROID) 50 MCG tablet TAKE ONE TABLET BY MOUTH EVERY DAY  90 tablet  3  . lisinopril (PRINIVIL,ZESTRIL) 20 MG tablet TAKE ONE TABLET BY MOUTH EVERY DAY  90 tablet  3  . Multiple Vitamin (MULTIVITAMINS PO) Take 1 tablet by mouth daily.        Marland Kitchen POTASSIUM GLUCONATE PO Take 595 mg by mouth.       . Ferrous Sulfate (IRON) 325 (65 FE) MG TABS Take by mouth.      . DISCONTD: cephALEXin (KEFLEX) 500  MG capsule Take 500 mg by mouth 4 (four) times daily.       BP 130/80  Pulse 54  Temp 98.1 F (36.7 C) (Oral)  Wt 89 lb (40.37 kg)  SpO2 97%  Review of Systems  Constitutional: Positive for fatigue. Negative for fever, chills, appetite change and unexpected weight change.  HENT: Negative for ear pain, congestion, sore throat, trouble swallowing, neck pain, voice change and sinus pressure.   Eyes: Negative for visual disturbance.  Respiratory: Negative for cough, shortness of breath, wheezing and stridor.   Cardiovascular: Negative for chest pain, palpitations and leg swelling.  Gastrointestinal: Positive for constipation. Negative for nausea, vomiting, abdominal pain, diarrhea, blood in stool, abdominal distention and anal bleeding.  Genitourinary: Negative for dysuria, urgency, frequency, hematuria, flank pain and difficulty urinating.  Musculoskeletal: Negative for myalgias, arthralgias and gait problem.  Skin: Negative for color change and rash.  Neurological: Positive for weakness. Negative for dizziness and headaches.  Hematological: Negative for adenopathy. Does not bruise/bleed easily.  Psychiatric/Behavioral: Negative for suicidal ideas, disturbed wake/sleep cycle and dysphoric mood. The patient is not nervous/anxious.        Objective:   Physical Exam  Constitutional: She is oriented to person, place, and time. She appears  well-developed and well-nourished. No distress.  HENT:  Head: Normocephalic and atraumatic.  Right Ear: External ear normal.  Left Ear: External ear normal.  Nose: Nose normal.  Mouth/Throat: Oropharynx is clear and moist. No oropharyngeal exudate.  Eyes: Conjunctivae are normal. Pupils are equal, round, and reactive to light. Right eye exhibits no discharge. Left eye exhibits no discharge. No scleral icterus.  Neck: Normal range of motion. Neck supple. No tracheal deviation present. No thyromegaly present.  Cardiovascular: Normal rate, regular rhythm,  normal heart sounds and intact distal pulses.  Exam reveals no gallop and no friction rub.   No murmur heard. Pulmonary/Chest: Effort normal and breath sounds normal. No respiratory distress. She has no wheezes. She has no rales. She exhibits no tenderness.  Abdominal: There is no tenderness (no CVA tenderness).  Musculoskeletal: Normal range of motion. She exhibits no edema and no tenderness.  Lymphadenopathy:    She has no cervical adenopathy.  Neurological: She is alert and oriented to person, place, and time. No cranial nerve deficit. She exhibits normal muscle tone. Coordination normal.  Skin: Skin is warm and dry. No rash noted. She is not diaphoretic. No erythema. No pallor.  Psychiatric: She has a normal mood and affect. Her behavior is normal. Judgment and thought content normal.          Assessment & Plan:

## 2011-12-16 ENCOUNTER — Encounter: Payer: Self-pay | Admitting: Internal Medicine

## 2011-12-16 ENCOUNTER — Ambulatory Visit (INDEPENDENT_AMBULATORY_CARE_PROVIDER_SITE_OTHER): Payer: Medicare Other | Admitting: Internal Medicine

## 2011-12-16 VITALS — BP 142/72 | HR 66 | Temp 98.1°F | Wt 87.2 lb

## 2011-12-16 DIAGNOSIS — F419 Anxiety disorder, unspecified: Secondary | ICD-10-CM

## 2011-12-16 DIAGNOSIS — R5383 Other fatigue: Secondary | ICD-10-CM

## 2011-12-16 DIAGNOSIS — Z23 Encounter for immunization: Secondary | ICD-10-CM

## 2011-12-16 DIAGNOSIS — R5381 Other malaise: Secondary | ICD-10-CM

## 2011-12-16 DIAGNOSIS — F411 Generalized anxiety disorder: Secondary | ICD-10-CM

## 2011-12-16 MED ORDER — INFLUENZA VIRUS VACC SPLIT PF IM SUSP: 0.5000 mL | Freq: Once | INTRAMUSCULAR | Status: DC
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
## 2011-12-16 NOTE — Assessment & Plan Note (Signed)
Symptoms recently worsened as patient is unable to sleep. Discussed potentially starting medication to help with sleep but she would like to hold off on this for now. Reviewed recent lab work which showed normal blood counts, kidney and liver function and electrolytes as well as normal thyroid function. Followup 3 months or sooner as needed.

## 2011-12-16 NOTE — Assessment & Plan Note (Signed)
Patient with recent increased anxiety secondary to break in at her home. Offered support today. Discussed potentially starting medication to help with anxiety and sleep. She would like to hold off on this for now. She will call if she changes her mind about this.

## 2012-01-12 ENCOUNTER — Other Ambulatory Visit: Payer: Self-pay | Admitting: Cardiovascular Disease

## 2012-01-12 NOTE — Telephone Encounter (Signed)
Refilled Digoxin. 

## 2012-01-21 ENCOUNTER — Emergency Department: Payer: Self-pay | Admitting: Internal Medicine

## 2012-01-21 LAB — COMPREHENSIVE METABOLIC PANEL
Albumin: 4.3 g/dL (ref 3.4–5.0)
Alkaline Phosphatase: 108 U/L (ref 50–136)
Anion Gap: 8 (ref 7–16)
Calcium, Total: 8.6 mg/dL (ref 8.5–10.1)
Creatinine: 0.66 mg/dL (ref 0.60–1.30)
SGOT(AST): 28 U/L (ref 15–37)
SGPT (ALT): 25 U/L (ref 12–78)
Sodium: 138 mmol/L (ref 136–145)
Total Protein: 7.2 g/dL (ref 6.4–8.2)

## 2012-01-21 LAB — CBC
HCT: 42.6 % (ref 35.0–47.0)
HGB: 14.9 g/dL (ref 12.0–16.0)
MCH: 32.3 pg (ref 26.0–34.0)
MCHC: 34.9 g/dL (ref 32.0–36.0)
MCV: 93 fL (ref 80–100)
RBC: 4.6 10*6/uL (ref 3.80–5.20)

## 2012-01-21 LAB — TROPONIN I: Troponin-I: 0.02 ng/mL

## 2012-01-21 LAB — URINALYSIS, COMPLETE
Bacteria: NONE SEEN
Glucose,UR: NEGATIVE mg/dL (ref 0–75)
Leukocyte Esterase: NEGATIVE
Nitrite: NEGATIVE
Protein: NEGATIVE
Specific Gravity: 1.005 (ref 1.003–1.030)
WBC UR: <1 /HPF (ref 0–5)

## 2012-01-21 LAB — DIGOXIN LEVEL: Digoxin: 1.34 ng/mL

## 2012-01-21 LAB — CK TOTAL AND CKMB (NOT AT ARMC): CK, Total: 58 U/L (ref 21–215)

## 2012-01-22 ENCOUNTER — Telehealth: Payer: Self-pay | Admitting: Internal Medicine

## 2012-01-22 NOTE — Telephone Encounter (Signed)
Caller: Jenny Burns; Patient Name: Jenny Burns; PCP: Ronna Polio (Adults only); Best Callback Phone Number: [phone]; Reason for call: Other   01-22-12  He said they had to take her to the ED yesterday due to difficulty getting around  from pain.  She was unable to go via car   She is to have an office recheck in around 3 days or so and he is unsure how he would get her there.   Jenny Burns is coming out tomorrow PT,OT and Child psychotherapist.   I told him to talk to the social worker that there are resourses out there that could assist with transportation to office visits.  I told him to see how she does with PT and see what social worker can help with and then call first of week to set up appt

## 2012-01-27 ENCOUNTER — Ambulatory Visit (INDEPENDENT_AMBULATORY_CARE_PROVIDER_SITE_OTHER): Payer: Medicare Other | Admitting: Internal Medicine

## 2012-01-27 ENCOUNTER — Encounter: Payer: Self-pay | Admitting: Internal Medicine

## 2012-01-27 ENCOUNTER — Telehealth: Payer: Self-pay | Admitting: Internal Medicine

## 2012-01-27 ENCOUNTER — Ambulatory Visit (INDEPENDENT_AMBULATORY_CARE_PROVIDER_SITE_OTHER)
Admission: RE | Admit: 2012-01-27 | Discharge: 2012-01-27 | Disposition: A | Discharge status: Home / Self Care | Payer: Medicare Other | Source: Ambulatory Visit | Attending: Internal Medicine | Admitting: Internal Medicine

## 2012-01-27 VITALS — BP 120/72 | HR 61 | Temp 97.5°F

## 2012-01-27 DIAGNOSIS — M25569 Pain in unspecified knee: Secondary | ICD-10-CM

## 2012-01-27 DIAGNOSIS — M25561 Pain in right knee: Secondary | ICD-10-CM

## 2012-01-27 DIAGNOSIS — M25562 Pain in left knee: Secondary | ICD-10-CM

## 2012-01-27 DIAGNOSIS — S82202A Unspecified fracture of shaft of left tibia, initial encounter for closed fracture: Secondary | ICD-10-CM

## 2012-01-27 DIAGNOSIS — R142 Eructation: Secondary | ICD-10-CM | POA: Insufficient documentation

## 2012-01-27 DIAGNOSIS — R143 Flatulence: Secondary | ICD-10-CM

## 2012-01-27 DIAGNOSIS — R110 Nausea: Secondary | ICD-10-CM

## 2012-01-27 LAB — COMPREHENSIVE METABOLIC PANEL
ALT: 24 U/L (ref 0–35)
Alkaline Phosphatase: 87 U/L (ref 39–117)
Sodium: 136 mEq/L (ref 135–145)
Total Bilirubin: 0.6 mg/dL (ref 0.3–1.2)
Total Protein: 6.6 g/dL (ref 6.0–8.3)

## 2012-01-27 MED ORDER — HYDROCODONE-ACETAMINOPHEN 5-500 MG PO TABS: 0.5000 | ORAL_TABLET | Freq: Two times a day (BID) | ORAL | Status: DC

## 2012-01-27 MED ORDER — ONDANSETRON 8 MG PO TBDP: 8.0000 mg | ORAL_TABLET | Freq: Three times a day (TID) | ORAL | Status: DC | PRN

## 2012-01-27 MED ORDER — HYDROCODONE-ACETAMINOPHEN 2.5-325 MG PO TABS: 1.0000 | ORAL_TABLET | Freq: Two times a day (BID) | ORAL | Status: DC | PRN

## 2012-01-27 MED ORDER — PANTOPRAZOLE SODIUM 40 MG PO TBEC: 40.0000 mg | DELAYED_RELEASE_TABLET | Freq: Every day | ORAL | Status: DC

## 2012-01-27 NOTE — Telephone Encounter (Signed)
Verbal report on plain xray of right knee showed possible tibial fracture. Can you please let pt and her son know? I will put in an urgent orthopedic referral.

## 2012-01-27 NOTE — Assessment & Plan Note (Signed)
Suspect that belching and nausea are secondary to recent course of antibiotics. Will add pantoprazole. Will also send testing for H. Pylori. Patient will followup in one week.

## 2012-01-27 NOTE — Assessment & Plan Note (Signed)
Symptoms seem most consistent with severe osteoarthritis. However, given acute worsening of pain over the last couple of weeks, we obtained plain x-rays which were concerning for possible tibial fracture on the right. Will set up orthopedic evaluation. Prescription written for hydrocodone for patient to use as needed for severe pain. Encouraged her son to limit Tylenol intake to less than 3 g per day.

## 2012-01-27 NOTE — Telephone Encounter (Signed)
Patients son [name] advised via telephone, Jenny Burns has an appt tomorrow at [facility] with Dr. Charlann Boxer at 2:00pm.

## 2012-01-27 NOTE — Progress Notes (Signed)
Pharmacist called from [facility] stating that Hydrocodone wasn't available in 2.5/325, Dr. Dan Humphreys gave verbal ok for Rx to be changed to Hydrocodone 5/500 one half tablet twice daily, #30.

## 2012-01-27 NOTE — Progress Notes (Signed)
Subjective:    Patient ID: Jenny Burns, female    DOB: 1916/07/18, 78 y.o.   MRN: [id number]  HPI 78 year old female presents for acute visit complaining of severe bilateral knee pain worse in the right knee versus left. She reports that this has been gradually progressive over the last couple of weeks. She is unsure if she may have had injury to her right knee. The pain is so severe, she describes it as if her knees are breaking when she moves them. She has occasionally had some swelling in her lower extremities however this has not been persistent. She has been unable to stand or walk because of severe pain. She ultimately went to the ER for evaluation earlier this week and reports that she was diagnosed with a "sinus infection" and discharged home with antibiotics. No imaging of her knees was completed. She has been taking Tylenol, 2 extra strength tablets up to 4 times daily with no improvement in pain. She reports that after taking the antibiotic she developed some bloating in her abdomen and frequent belching. She denies any ongoing sinus congestion.  Outpatient Encounter Prescriptions as of 01/27/2012  Medication Sig Dispense Refill  . amLODipine (NORVASC) 5 MG tablet Take 1 tablet (5 mg total) by mouth daily.  90 tablet  3  . aspirin 81 MG tablet Take 81 mg by mouth daily.      Marland Kitchen atorvastatin (LIPITOR) 10 MG tablet Take 1 tablet (10 mg total) by mouth daily.  90 tablet  3  . Cholecalciferol (VITAMIN D3) 400 UNITS CAPS Take 1 tablet by mouth daily.        . cyanocobalamin 2000 MCG tablet Take 2,000 mcg by mouth daily.      . digoxin (LANOXIN) 0.125 MG tablet TAKE ONE TABLET BY MOUTH EVERY DAY  30 tablet  5  . docusate sodium (DULCOLAX) 100 MG capsule Take 100 mg by mouth as needed.       . Ferrous Sulfate (IRON) 325 (65 FE) MG TABS Take by mouth.      Marland Kitchen HYDROcodone-acetaminophen (VICODIN) 5-500 MG per tablet Take 0.5 tablets by mouth 2 (two) times daily.  30 tablet  0  . isosorbide  mononitrate (IMDUR) 30 MG 24 hr tablet Take 1 tablet (30 mg total) by mouth daily.  90 tablet  3  . levothyroxine (SYNTHROID, LEVOTHROID) 50 MCG tablet TAKE ONE TABLET BY MOUTH EVERY DAY  90 tablet  3  . lisinopril (PRINIVIL,ZESTRIL) 20 MG tablet TAKE ONE TABLET BY MOUTH EVERY DAY  90 tablet  3  . Multiple Vitamin (MULTIVITAMINS PO) Take 1 tablet by mouth daily.        Marland Kitchen POTASSIUM GLUCONATE PO Take 595 mg by mouth.       . DISCONTD: HYDROcodone-acetaminophen (VICODIN) 5-500 MG per tablet Take 0.5 tablets by mouth 2 (two) times daily.      . ondansetron (ZOFRAN-ODT) 8 MG disintegrating tablet Take 1 tablet (8 mg total) by mouth every 8 (eight) hours as needed for nausea.  20 tablet  0  . pantoprazole (PROTONIX) 40 MG tablet Take 1 tablet (40 mg total) by mouth daily.  30 tablet  3  . DISCONTD: Hydrocodone-Acetaminophen 2.5-325 MG TABS Take 1 tablet by mouth 2 (two) times daily as needed.  60 tablet  3   Facility-Administered Encounter Medications as of 01/27/2012  Medication Dose Route Frequency Provider Last Rate Last Dose  . influenza  inactive virus vaccine (FLUZONE/FLUARIX) injection 0.5 mL  0.5 mL Intramuscular Once  Shelia Media, MD       BP 120/72  Pulse 61  Temp 97.5 F (36.4 C) (Oral)  SpO2 99%   Review of Systems  Constitutional: Negative for fever, chills, appetite change, fatigue and unexpected weight change.  HENT: Negative for ear pain, congestion, sore throat, trouble swallowing, neck pain, voice change and sinus pressure.   Eyes: Negative for visual disturbance.  Respiratory: Negative for cough, shortness of breath, wheezing and stridor.   Cardiovascular: Positive for leg swelling. Negative for chest pain and palpitations.  Gastrointestinal: Negative for nausea, vomiting, abdominal pain, diarrhea, constipation, blood in stool, abdominal distention and anal bleeding.  Genitourinary: Negative for dysuria and flank pain.  Musculoskeletal: Positive for myalgias, joint  swelling, arthralgias and gait problem.  Skin: Negative for color change and rash.  Neurological: Negative for dizziness and headaches.  Hematological: Negative for adenopathy. Does not bruise/bleed easily.  Psychiatric/Behavioral: Negative for suicidal ideas, disturbed wake/sleep cycle and dysphoric mood. The patient is not nervous/anxious.        Objective:   Physical Exam  Constitutional: She is oriented to person, place, and time. She appears well-developed and well-nourished. No distress.  HENT:  Head: Normocephalic and atraumatic.  Right Ear: External ear normal.  Left Ear: External ear normal.  Nose: Nose normal.  Mouth/Throat: Oropharynx is clear and moist. No oropharyngeal exudate.  Eyes: Conjunctivae normal are normal. Pupils are equal, round, and reactive to light. Right eye exhibits no discharge. Left eye exhibits no discharge. No scleral icterus.  Neck: Normal range of motion. Neck supple. No tracheal deviation present. No thyromegaly present.  Cardiovascular: Normal rate, regular rhythm, normal heart sounds and intact distal pulses.  Exam reveals no gallop and no friction rub.   No murmur heard. Pulmonary/Chest: Effort normal and breath sounds normal. No respiratory distress. She has no wheezes. She has no rales. She exhibits no tenderness.  Abdominal: Soft. Bowel sounds are normal. She exhibits no distension. There is no tenderness.  Musculoskeletal: She exhibits no edema and no tenderness.       Right knee: She exhibits decreased range of motion. She exhibits no swelling and no effusion.       Left knee: She exhibits decreased range of motion. She exhibits no swelling and no effusion.       Legs: Lymphadenopathy:    She has no cervical adenopathy.  Neurological: She is alert and oriented to person, place, and time. No cranial nerve deficit. She exhibits normal muscle tone. Coordination normal.  Skin: Skin is warm and dry. No rash noted. She is not diaphoretic. No  erythema. No pallor.  Psychiatric: She has a normal mood and affect. Her behavior is normal. Judgment and thought content normal.          Assessment & Plan:

## 2012-01-28 ENCOUNTER — Encounter: Payer: Self-pay | Admitting: *Deleted

## 2012-01-28 ENCOUNTER — Telehealth: Payer: Self-pay | Admitting: Internal Medicine

## 2012-01-28 NOTE — Telephone Encounter (Signed)
Caller: [place]/Child; Patient Name: Jenny Burns; PCP: Orville Govern; Best Callback Phone Number: [phone] Injury to Right knee some way on Wed evening/night 01/20/2012.  Has had pain in knee, difficulty walking.  Seen in office 10/16 and Fracture Tibia found on Xray per son.   Has been struggling with walker but not moving well, does have wheel chair available.  Is to see Orthopedic 2 pm today.  Asking how much weight she should be putting on leg until seen.  Triaged in Knee Injury - Disposition:  Provide Home Care dut to other situations.  Recommended non-weight bearing / using wheelchair until seeing orthopedic this afternoon.

## 2012-01-29 ENCOUNTER — Other Ambulatory Visit: Payer: Self-pay | Admitting: Orthopedic Surgery

## 2012-01-29 DIAGNOSIS — S82143A Displaced bicondylar fracture of unspecified tibia, initial encounter for closed fracture: Secondary | ICD-10-CM

## 2012-01-29 LAB — HELICOBACTER PYLORI  ANTIBODY, IGM: Helicobacter pylori, IgM: 3.1 U/mL (ref <9.0)

## 2012-02-01 ENCOUNTER — Telehealth: Payer: Self-pay | Admitting: Internal Medicine

## 2012-02-01 MED ORDER — LACTULOSE 10 GM/15ML PO SOLN: 20.0000 g | Freq: Three times a day (TID) | ORAL | Status: DC

## 2012-02-01 NOTE — Telephone Encounter (Signed)
Caller: [name]; Patient Name: Jenny Burns; PCP: Ronna Polio (Adults only); Best Callback Phone Number: [phone] Onset-01/24/12 Pt has not had a bowel movement in over a week. They have been giving her 2 Dulcolax stool softners daily, Miralax once a day and have added Milk of Magnesia. Pt is on pain medications due to a leg fracture that happened last week. Emergent s/s of Constipation protocol r/o. Pt to see provider within 24hrs. Daughter would like any other recommendations doctor may have. Please call.

## 2012-02-01 NOTE — Telephone Encounter (Signed)
Patients son advised as instructed via telephone, he will give [place] a call back tomorrow since it's so late in the day today.

## 2012-02-01 NOTE — Telephone Encounter (Signed)
We can try using Lactulose. I will call this in for her. She should take the medication every 2 hr today up to 3 doses. She should stop if BM. She should call if no BM today

## 2012-02-02 ENCOUNTER — Ambulatory Visit
Admission: RE | Admit: 2012-02-02 | Discharge: 2012-02-02 | Disposition: A | Discharge status: Home / Self Care | Payer: Medicare Other | Source: Ambulatory Visit | Attending: Orthopedic Surgery | Admitting: Orthopedic Surgery

## 2012-02-02 DIAGNOSIS — S82143A Displaced bicondylar fracture of unspecified tibia, initial encounter for closed fracture: Secondary | ICD-10-CM

## 2012-02-02 NOTE — Telephone Encounter (Signed)
Rx called onto pharmacy due to systom error

## 2012-02-04 ENCOUNTER — Emergency Department (HOSPITAL_COMMUNITY): Payer: Medicare Other

## 2012-02-04 ENCOUNTER — Encounter: Payer: Self-pay | Admitting: Internal Medicine

## 2012-02-04 ENCOUNTER — Encounter (HOSPITAL_COMMUNITY): Payer: Self-pay | Admitting: *Deleted

## 2012-02-04 ENCOUNTER — Emergency Department (HOSPITAL_COMMUNITY)
Admission: EM | Admit: 2012-02-04 | Discharge: 2012-02-05 | Disposition: A | Discharge status: Home / Self Care | Payer: Medicare Other | Attending: Emergency Medicine | Admitting: Emergency Medicine

## 2012-02-04 ENCOUNTER — Telehealth: Payer: Self-pay | Admitting: Internal Medicine

## 2012-02-04 ENCOUNTER — Ambulatory Visit (INDEPENDENT_AMBULATORY_CARE_PROVIDER_SITE_OTHER): Payer: Medicare Other | Admitting: Internal Medicine

## 2012-02-04 VITALS — BP 128/70 | HR 80

## 2012-02-04 DIAGNOSIS — E039 Hypothyroidism, unspecified: Secondary | ICD-10-CM | POA: Insufficient documentation

## 2012-02-04 DIAGNOSIS — I10 Essential (primary) hypertension: Secondary | ICD-10-CM | POA: Insufficient documentation

## 2012-02-04 DIAGNOSIS — I4891 Unspecified atrial fibrillation: Secondary | ICD-10-CM | POA: Insufficient documentation

## 2012-02-04 DIAGNOSIS — M810 Age-related osteoporosis without current pathological fracture: Secondary | ICD-10-CM | POA: Insufficient documentation

## 2012-02-04 DIAGNOSIS — K5900 Constipation, unspecified: Secondary | ICD-10-CM

## 2012-02-04 DIAGNOSIS — E782 Mixed hyperlipidemia: Secondary | ICD-10-CM | POA: Insufficient documentation

## 2012-02-04 DIAGNOSIS — R109 Unspecified abdominal pain: Secondary | ICD-10-CM

## 2012-02-04 DIAGNOSIS — E119 Type 2 diabetes mellitus without complications: Secondary | ICD-10-CM | POA: Insufficient documentation

## 2012-02-04 DIAGNOSIS — R110 Nausea: Secondary | ICD-10-CM | POA: Insufficient documentation

## 2012-02-04 LAB — BASIC METABOLIC PANEL
BUN: 14 mg/dL (ref 6–23)
CO2: 31 mEq/L (ref 19–32)
Calcium: 8.5 mg/dL (ref 8.4–10.5)
Chloride: 92 mEq/L — ABNORMAL LOW (ref 96–112)
Creatinine, Ser: 0.8 mg/dL (ref 0.50–1.10)
GFR calc Af Amer: 71 mL/min — ABNORMAL LOW (ref >90)
GFR calc non Af Amer: 62 mL/min — ABNORMAL LOW (ref >90)
Glucose, Bld: 145 mg/dL — ABNORMAL HIGH (ref 70–99)
Potassium: 3.8 mEq/L (ref 3.5–5.1)
Sodium: 130 mEq/L — ABNORMAL LOW (ref 135–145)

## 2012-02-04 LAB — POCT I-STAT, CHEM 8
BUN: 11 mg/dL (ref 6–23)
BUN: 9 mg/dL (ref 6–23)
Calcium, Ion: 1.11 mmol/L — ABNORMAL LOW (ref 1.13–1.30)
Calcium, Ion: 1.11 mmol/L — ABNORMAL LOW (ref 1.13–1.30)
Chloride: 95 mEq/L — ABNORMAL LOW (ref 96–112)
Chloride: 97 mEq/L (ref 96–112)
Creatinine, Ser: 0.8 mg/dL (ref 0.50–1.10)
Glucose, Bld: 95 mg/dL (ref 70–99)
Glucose, Bld: 99 mg/dL (ref 70–99)
HCT: 43 % (ref 36.0–46.0)
Potassium: 3.9 mEq/L (ref 3.5–5.1)

## 2012-02-04 LAB — CBC
HCT: 34.2 % — ABNORMAL LOW (ref 36.0–46.0)
Hemoglobin: 11.6 g/dL — ABNORMAL LOW (ref 12.0–15.0)
MCH: 31.3 pg (ref 26.0–34.0)
MCHC: 33.9 g/dL (ref 30.0–36.0)
MCV: 92.2 fL (ref 78.0–100.0)
Platelets: 241 10*3/uL (ref 150–400)
RBC: 3.71 MIL/uL — ABNORMAL LOW (ref 3.87–5.11)
RDW: 13.9 % (ref 11.5–15.5)
WBC: 6.9 10*3/uL (ref 4.0–10.5)

## 2012-02-04 LAB — URINE MICROSCOPIC-ADD ON

## 2012-02-04 LAB — URINALYSIS, ROUTINE W REFLEX MICROSCOPIC
Bilirubin Urine: NEGATIVE
Glucose, UA: NEGATIVE mg/dL
Ketones, ur: NEGATIVE mg/dL
Nitrite: NEGATIVE
Protein, ur: NEGATIVE mg/dL
Specific Gravity, Urine: 1.007 (ref 1.005–1.030)
Urobilinogen, UA: 1 mg/dL (ref 0.0–1.0)
pH: 7 (ref 5.0–8.0)

## 2012-02-04 LAB — LACTIC ACID, PLASMA: Lactic Acid, Venous: 1.5 mmol/L (ref 0.5–2.2)

## 2012-02-04 MED ORDER — FLEET ENEMA 7-19 GM/118ML RE ENEM
1.0000 | ENEMA | Freq: Once | RECTAL | Status: AC
  Administered 2012-02-04: 1 via RECTAL
  Filled 2012-02-04: qty 1

## 2012-02-04 MED ORDER — MORPHINE SULFATE 4 MG/ML IJ SOLN
2.0000 mg | Freq: Once | INTRAMUSCULAR | Status: AC
  Administered 2012-02-04: 2 mg via INTRAVENOUS
  Filled 2012-02-04: qty 1

## 2012-02-04 MED ORDER — MILK AND MOLASSES ENEMA
RECTAL | Status: AC
  Administered 2012-02-04: 250 mL via RECTAL
  Filled 2012-02-04: qty 250

## 2012-02-04 MED ORDER — IOHEXOL 300 MG/ML  SOLN
80.0000 mL | Freq: Once | INTRAMUSCULAR | Status: AC | PRN
  Administered 2012-02-04: 80 mL via INTRAVENOUS

## 2012-02-04 MED ORDER — SODIUM CHLORIDE 0.9 % IV SOLN
Freq: Once | INTRAVENOUS | Status: AC
  Administered 2012-02-04: via INTRAVENOUS

## 2012-02-04 MED ORDER — ONDANSETRON HCL 4 MG/2ML IJ SOLN
4.0000 mg | Freq: Once | INTRAMUSCULAR | Status: AC
  Administered 2012-02-04: 4 mg via INTRAVENOUS
  Filled 2012-02-04: qty 2

## 2012-02-04 MED ORDER — BISACODYL 10 MG RE SUPP
10.0000 mg | Freq: Once | RECTAL | Status: AC
  Administered 2012-02-04: 10 mg via RECTAL
  Filled 2012-02-04: qty 1

## 2012-02-04 MED ORDER — IOHEXOL 300 MG/ML  SOLN
20.0000 mL | INTRAMUSCULAR | Status: AC
  Administered 2012-02-04: 20 mL via ORAL

## 2012-02-04 MED ORDER — SODIUM CHLORIDE 0.9 % IV BOLUS (SEPSIS): 500.0000 mL | Freq: Once | INTRAVENOUS | Status: DC

## 2012-02-04 MED ORDER — SODIUM CHLORIDE 0.9 % IV BOLUS (SEPSIS)
500.0000 mL | Freq: Once | INTRAVENOUS | Status: AC
  Administered 2012-02-04: 500 mL via INTRAVENOUS

## 2012-02-04 MED ORDER — POLYETHYLENE GLYCOL 3350 17 GM/SCOOP PO POWD: 17.0000 g | Freq: Two times a day (BID) | ORAL | Status: AC

## 2012-02-04 NOTE — ED Notes (Signed)
Report given to [facility], RN in CDU.

## 2012-02-04 NOTE — ED Provider Notes (Signed)
Patient placed in CDU by and patient care resumed from [facility], PA-C.  Patient is here for abdominal pain and constipation after narcotic use and has received IVF and dulcolax without BM production . Plan per previous provider is to obtain CT abd to r/o other intra-abdominal process.  If CT neg, patient to receive an enema prior to discharge.  Pt in CT at this time, but RN reports good gas production.  CMP with hyponatremic, hypochloremia.  Patient receiving IV fluids at this time.  5:26 PM   Patient re-evaluated and is uncomfortable c/o increasing abdominal pain.  VSS. On exam: hemodynamically stable, NAD, heart w/ RRR, lungs CTAB, Abd tender to palpation generally, but more significantly in the LLQ; firm to palpation in the LLQ, no peripheral edema or calf tenderness.  Pain medication ordered.  Pt does not have the urge to defecate at this time, but reports passing of gas.    5:53 PM    CT abd with moderate amount of left-sided stool, no evidence of bowel obstruction.   Fleet enema without success.  Milk and Molasses enema ordered.  Chem 8 with an increase in sodium and chloride. Will complete administration of 1 L bolus and recheck.  Patient pain and nausea controlled.  7:43 PM   Pt with only watery BM at this time.  Asked RN to give Milk and Molasses enema at this time.  9:40 PM   Pt manually disimpacted by the RN.  2nd enema given.  Chem 8 rechecked after completion of 1L fluid bolus.   11:35 PM   Na and Cl levels back to normal.  Pt has had BM and manual disimpaction.  Will D/C home.    1. Medications: MiraLax, usual home medications  2. Treatment: Rest, drink plenty of fluids  3. Follow Up: With primary care physician tomorrow or Monday for today's visit.      Dahlia Client Keamber Macfadden, PA-C 02/04/12 2357

## 2012-02-04 NOTE — ED Provider Notes (Signed)
Medical screening examination/treatment/procedure(s) were conducted as a shared visit with non-physician practitioner(s) and myself.  I personally evaluated the patient during the encounter  Pt with constipation, abdominal tenderness, no vomiting. Plan for CT and labs to rule out other acute intraabdominal processes.   Seda Kronberg B. Bernette Mayers, MD 02/04/12 [phone]

## 2012-02-04 NOTE — Telephone Encounter (Signed)
Verbal order given to Harriett Sine OT with [facility] for 6 weeks for bathing.

## 2012-02-04 NOTE — ED Notes (Signed)
Patient was sent from her MD office.  She is having issues with constipation since 10-06.  Patient has not had a normal bm and she does not have a good appetite.  Patient was seen at the md office and the MD is concerned that patient may have a mass causing her sx.  Patient also reports weight loss.  She states her abd is tender to palpation.  Patient has hx of knee fx and narcotic use

## 2012-02-04 NOTE — Telephone Encounter (Signed)
I have no problem giving them a verbal ok for bathing (for Dr Dan Humphreys).   Pt was seen today and was sent to ER for evaluation.

## 2012-02-04 NOTE — Telephone Encounter (Signed)
Jenny Burns (OT) from [facility] needs a verbal order for patient to have home health aide for 6 weeks to assist with her bathing. Please advise. Contact number is [phone].

## 2012-02-04 NOTE — ED Provider Notes (Signed)
History     CSN: [id number]  Arrival date & time 02/04/12  1309   First MD Initiated Contact with Patient 02/04/12 1328      Chief Complaint  Patient presents with  . Constipation  . Nausea    (Consider location/radiation/quality/duration/timing/severity/associated sxs/prior treatment) HPI Patient presents to the emergency department with constipation for the last several weeks.  Patient, states, that she has had no bowel movement since October 6.  Patient seen her doctor several times and prescribed different medications for constipation without relief of her symptoms.  Patient denies vomiting, diarrhea, chest pain, shortness breath, headache, back pain, dysuria, fever, weakness, dizziness, or syncope.  Patient, states, that she has pain in her abdomen on the left.  Past Medical History  Diagnosis Date  . Diabetes mellitus   . Osteoporosis 05/1997  . Compression fracture of thoracic vertebra 05/2007    T11, right sacral fracture inf pubic ramus fx  . AF (atrial fibrillation)     2012  . Hyperlipidemia 04/1995  . Hypertension 1970s  . Hypothyroidism 1994    Past Surgical History  Procedure Date  . Btl   . Tumor removal 1950    shoulder  . Skin cancer excision 1980    Scalp  . Tumor of submand gland 1970  . Fracture surgery     multiple  . Doppler echocardiography 06/13/2003    nml EF 60% Mild LAE Mod MR Mild AR, TR  . Persantine cardiolite 12/01/2004    Poss Inf Scar neg ischemia EF 89%  . Total abdominal hysterectomy 11/2006    [facility] - Stress Incont, Procdentia  . Right shoulder replacement 09/27/2003    [facility] - Dr. Ernest Pine  . Ct head w/  and w/o 02/24/2006     Large subgleal hematoma; L superficial scal hematoma  . Ct spine 12/2006    L/S Compr fx L2  . Doppler echocardiography 05/23/07    Normal, LVF EF 65%, carotid dopplers 60-80% Bilat ICA; MRI brain nml / Abd & pelvis fxs.  . Mri head and brain 05/23/07      No acute changes, mild ASCVD  . Carotid US 05/23/07    LICA  60-79% RICA 40-59% (Dr. Arbie Cookey)  . Ct head limited w/o cm 06/28/08    [facility] - R Parietal soft tissue swelling, no acute changes  . Dexa 05/1999    Spine -2.82; Hip -2.98    Family History  Problem Relation Age of Onset  . Angina Mother     History  Substance Use Topics  . Smoking status: Never Smoker   . Smokeless tobacco: Never Used  . Alcohol Use: No    OB History    Grav Para Term Preterm Abortions TAB SAB Ect Mult Living                  Review of Systems All other systems negative except as documented in the HPI. All pertinent positives and negatives as reviewed in the HPI.  Allergies  Codeine and Sulfonamide derivatives  Home Medications   Current Outpatient Rx  Name Route Sig Dispense Refill  . AMLODIPINE BESYLATE 5 MG PO TABS Oral Take 1 tablet (5 mg total) by mouth daily. 90 tablet 3  . ASPIRIN EC 81 MG PO TBEC Oral Take 81 mg by mouth daily.    . ATORVASTATIN CALCIUM 10 MG PO TABS Oral Take 10 mg by mouth at bedtime.    Marland Kitchen VITAMIN D 1000 UNITS PO TABS Oral Take 1,000 Units  by mouth daily.    . CYANOCOBALAMIN 2000 MCG PO TABS Oral Take 2,000 mcg by mouth daily.    Marland Kitchen DIGOXIN 0.125 MG PO TABS Oral Take 0.125 mg by mouth daily.    Marland Kitchen DOCUSATE SODIUM 100 MG PO CAPS Oral Take 100 mg by mouth 2 (two) times daily as needed. For stool softner    . IRON 325 (65 FE) MG PO TABS Oral Take 325 mg by mouth daily.     Marland Kitchen HYDROCODONE-ACETAMINOPHEN 5-500 MG PO TABS Oral Take 0.5 tablets by mouth 3 (three) times daily.    . ISOSORBIDE MONONITRATE ER 30 MG PO TB24 Oral Take 30 mg by mouth at bedtime.    Marland Kitchen LACTULOSE 10 GM/15ML PO SOLN Oral Take 30 mLs (20 g total) by mouth 3 (three) times daily. Stop after bowel movement. 240 mL 0  . LEVOTHYROXINE SODIUM 50 MCG PO TABS Oral Take 50 mcg by mouth daily.    Marland Kitchen LISINOPRIL 20 MG PO TABS Oral Take 20 mg by mouth at bedtime.    . MULTIVITAMINS PO Oral Take 1 tablet by mouth daily.      Marland Kitchen ONDANSETRON 8 MG PO TBDP Oral Take 1 tablet (8 mg total)  by mouth every 8 (eight) hours as needed for nausea. 20 tablet 0  . PANTOPRAZOLE SODIUM 40 MG PO TBEC Oral Take 1 tablet (40 mg total) by mouth daily. 30 tablet 3  . POTASSIUM GLUCONATE PO Oral Take 595 mg by mouth daily.       BP 127/52  Pulse 107  Temp 97.8 F (36.6 C) (Oral)  Resp 16  SpO2 99%  Physical Exam  Nursing note and vitals reviewed. Constitutional: She appears well-developed and well-nourished. No distress.  HENT:  Head: Normocephalic and atraumatic.  Mouth/Throat: Oropharynx is clear and moist.  Eyes: Pupils are equal, round, and reactive to light.  Cardiovascular: Normal rate, regular rhythm and normal heart sounds.  Exam reveals no gallop and no friction rub.   No murmur heard. Pulmonary/Chest: Effort normal and breath sounds normal.  Abdominal: Normal appearance. Bowel sounds are decreased. There is no hepatosplenomegaly. There is no rigidity and no guarding. No hernia.      ED Course  Procedures (including critical care time)  Labs Reviewed  CBC - Abnormal; Notable for the following:    RBC 3.71 (*)     Hemoglobin 11.6 (*)     HCT 34.2 (*)     All other components within normal limits  BASIC METABOLIC PANEL - Abnormal; Notable for the following:    Sodium 130 (*)     Chloride 92 (*)     Glucose, Bld 145 (*)     GFR calc non Af Amer 62 (*)     GFR calc Af Amer 71 (*)     All other components within normal limits  URINALYSIS, ROUTINE W REFLEX MICROSCOPIC  LACTIC ACID, PLASMA   Dg Abd Acute W/chest  02/04/2012  *RADIOLOGY REPORT*  Clinical Data: Pain for 2 weeks.  Abdominal distention and pain.  ACUTE ABDOMEN SERIES (ABDOMEN 2 VIEW & CHEST 1 VIEW)  Comparison: Chest film of 06/28/2008.  Findings: Frontal view of the chest demonstrates degraded by overlying bra artifact.  Right shoulder arthroplasty.  Moderate osteopenia.  Patient rotated left.  Mild cardiomegaly. No pleural effusion or pneumothorax.  No congestive failure.  Artifactual increased density  over the right hemithorax inferiorly.  Osteopenia with lower thoracic vertebral augmentation.  Abominal films demonstrate no free intraperitoneal air  or significant air fluid levels on decubitus imaging.  Moderate amount of left-sided stool. Distal gas and stool.  Right hip arthroplasty.  No bowel obstruction.  No pneumatosis.  Vascular calcifications. Suspect remote parasymphyseal left pubic bone fractures.  IMPRESSION: Moderate colonic stool.  No obstruction or other acute process.   Original Report Authenticated By: Consuello Bossier, M.D.     Patient will have CT scan and further lab testing.  Patient, is observed to have moderate stool noted on her plain films.   MDM  MDM Reviewed: vitals and nursing note            Carlyle Dolly, PA-C 02/04/12 1540

## 2012-02-04 NOTE — ED Notes (Signed)
Denies n/v at this time. Family member reports last BM 01/17/12

## 2012-02-04 NOTE — Telephone Encounter (Signed)
Son, [place] calling.  She has constipation and has been taking Lactulose.  He has given 30mg  doses x 5 and still no bm.  She is having abdominal pain, intermittently.  Has a fullness feeling.  Has slight distention.  Minimal flatulence.   He offered coffee and hot tea yesterday, 10/23.  No effect.  She is not as ambulatory due to the fx leg.   No vomiting.  Has had a cough with slight mucous.   Triaged per Constipation. Appt. scheduled for 11a with Dr. Lorin Picket.  (He needed time to get her dressed and in the car).

## 2012-02-05 NOTE — Progress Notes (Signed)
  Subjective:    Patient ID: Jenny Burns, female    DOB: [birth year], 77 y.o.   MRN: [id number]  HPI 78 year old female with past history of hypertension, anemia and osteoporosis. She is accompanied by her son.  History obtained from both of them.  According to her son, she recently fractured her knee.  Has required increased pain meds.  Is not moving around.  Now with increased abdominal fullness and discomfort. Has not had a bowel movement since 01/17/12.  Has been using Lactulose, Phillips MOM, Miralax, Dulcolax suppository and warm water, etc.  No results.  Appetite is down.  She is eating very little.  No vomiting, but she states she feels she would feel better if she could vomit.  Increased gas.  States she feels full and feels that food "won't go down".  Significant weight loss (per son).  Weak.    Past Medical History  Diagnosis Date  . Diabetes mellitus   . Osteoporosis 05/1997  . Compression fracture of thoracic vertebra 05/2007    T11, right sacral fracture inf pubic ramus fx  . AF (atrial fibrillation)     2012  . Hyperlipidemia 04/1995  . Hypertension 1970s  . Hypothyroidism 1994    Review of Systems Denies any chest pain or tightness.  Feels her breathing is stable.  No vomiting, but states she feels full and doesn't have an appetite.  Eating very little.  Has lost weight.  Increased gas.  Abdomen feel full and bloated.  No urinary change reported.  Drinking Ensure.      Objective:   Physical Exam Filed Vitals:   02/04/12 1115  BP: 128/70  Pulse: 44   78 year old female in no acute distress.   HEENT:  Nares - clear.  OP- without lesions or erythema.  NECK:  Supple, nontender.   HEART:  Appears to be regular. LUNGS:  Without crackles or wheezing audible.  Respirations even and unlabored.  ABDOMEN:  Soft.  No significant distention.  She did have increased pain in the lower abdomen - mostly localized to the LLQ.  Increased fullness in this area.   Bowels sounds are  present.   RECTAL:  Could feel stool.  Unable to disimpact.  (Stool too high).  Increased discomfort with exam.  Heme negative.    EXTREMITIES:  No increased edema to be present.                     Assessment & Plan:  GI.  No bowel movement since 01/17/12.  See above for symptoms and exam.  Increased weakness, not eating and significant weight loss.  Unable to disimpact her in the office.  Discussed with the patient and her son (at length).  Needs further evaluation including xray, labs and possible CT.  Discussed the need for ER evaluation - for reasons listed above.  May need admission for complete w/up and treatment.  Pt very weak.  They were in agreement.  Discussed with ER staff at [facility].  Information given.  Son will drive patient to ER now.  I spent more than 25 minutes with the patient and her son with more than 50% of time spent in consultation regarding the above.

## 2012-02-05 NOTE — ED Provider Notes (Signed)
Medical screening examination/treatment/procedure(s) were performed by non-physician practitioner and as supervising physician I was immediately available for consultation/collaboration.  Saysha Menta, MD 02/05/12 0048 

## 2012-02-08 ENCOUNTER — Telehealth: Payer: Self-pay | Admitting: Internal Medicine

## 2012-02-08 NOTE — Telephone Encounter (Signed)
We could move her to 1pm on Wednesday.

## 2012-02-08 NOTE — Telephone Encounter (Signed)
Pt son called pt has appointment on 11/1 @ 11:30 and he wanted her to be seen sooner

## 2012-02-09 NOTE — Telephone Encounter (Signed)
Spoke to [place] and he would like to move appt to 02/10/2012 at 1:00.  Vanessa scheduled the appt.

## 2012-02-09 NOTE — Telephone Encounter (Signed)
Called number given and asked to speak to [facility] and patients mother stated that he is unavailable at the moment but she would have him return my call.

## 2012-02-10 ENCOUNTER — Encounter: Payer: Self-pay | Admitting: Internal Medicine

## 2012-02-10 ENCOUNTER — Ambulatory Visit (INDEPENDENT_AMBULATORY_CARE_PROVIDER_SITE_OTHER): Payer: Medicare Other | Admitting: Internal Medicine

## 2012-02-10 VITALS — BP 140/80 | HR 74 | Temp 98.6°F | Wt 90.0 lb

## 2012-02-10 DIAGNOSIS — R1310 Dysphagia, unspecified: Secondary | ICD-10-CM

## 2012-02-10 DIAGNOSIS — R141 Gas pain: Secondary | ICD-10-CM

## 2012-02-10 DIAGNOSIS — M25561 Pain in right knee: Secondary | ICD-10-CM

## 2012-02-10 DIAGNOSIS — M25569 Pain in unspecified knee: Secondary | ICD-10-CM

## 2012-02-10 DIAGNOSIS — K5900 Constipation, unspecified: Secondary | ICD-10-CM

## 2012-02-10 DIAGNOSIS — R142 Eructation: Secondary | ICD-10-CM

## 2012-02-10 NOTE — Progress Notes (Signed)
Subjective:    Patient ID: Jenny Burns, female    DOB: 1916-07-08, 78 y.o.   MRN: [id number]  HPI 78 year old female with history of hypertension and recent episode of right tibial fracture complicated by use of narcotics with constipation and impaction presents for followup. At her last visit, she was seen by another provider in her office for constipation. She reports that she did not have a bowel movement for 26 days. She was unable to be disimpacted in the office and was ultimately sent to the [facility] emergency department where she underwent two enemas and manual disimpaction with improvement. She was then started on daily MiraLax. She reports that she is now having regular bowel movements however they're sometimes pasty and difficult to pass. She denies any abdominal pain, nausea, change in appetite.  In regards to her right knee pain and swelling she reports symptoms are much improved. She is typically taking hydrocodone once per day as needed for severe pain. She has followup with orthopedics next week. She was instructed by them in the past to avoid weightbearing and she has been trying to do so at home. Next  She is also concerned today about some difficulty swallowing solid foods. This is been ongoing for months. Her son reports that home health has been involved including speech pathology. He reports that they have suggested swallow study. She denies any pain in her upper chest or upper epigastric area. She denies any vomiting. She does not have difficulty swallowing liquids.  Outpatient Encounter Prescriptions as of 02/10/2012  Medication Sig Dispense Refill  . amLODipine (NORVASC) 5 MG tablet Take 1 tablet (5 mg total) by mouth daily.  90 tablet  3  . aspirin EC 81 MG tablet Take 81 mg by mouth daily.      Marland Kitchen atorvastatin (LIPITOR) 10 MG tablet Take 10 mg by mouth at bedtime.      . cholecalciferol (VITAMIN D) 1000 UNITS tablet Take 1,000 Units by mouth daily.      . cyanocobalamin  2000 MCG tablet Take 2,000 mcg by mouth daily.      . digoxin (LANOXIN) 0.125 MG tablet Take 0.125 mg by mouth daily.      Marland Kitchen docusate sodium (DULCOLAX) 100 MG capsule Take 100 mg by mouth 2 (two) times daily as needed. For stool softner      . Ferrous Sulfate (IRON) 325 (65 FE) MG TABS Take 325 mg by mouth daily.       Marland Kitchen HYDROcodone-acetaminophen (VICODIN) 5-500 MG per tablet Take 0.5 tablets by mouth 3 (three) times daily.      . isosorbide mononitrate (IMDUR) 30 MG 24 hr tablet Take 30 mg by mouth at bedtime.      Marland Kitchen lactulose (CHRONULAC) 10 GM/15ML solution Take 30 mLs (20 g total) by mouth 3 (three) times daily. Stop after bowel movement.  240 mL  0  . levothyroxine (SYNTHROID, LEVOTHROID) 50 MCG tablet Take 50 mcg by mouth daily.      Marland Kitchen lisinopril (PRINIVIL,ZESTRIL) 20 MG tablet Take 20 mg by mouth at bedtime.      . Multiple Vitamin (MULTIVITAMINS PO) Take 1 tablet by mouth daily.        . ondansetron (ZOFRAN-ODT) 8 MG disintegrating tablet Take 1 tablet (8 mg total) by mouth every 8 (eight) hours as needed for nausea.  20 tablet  0  . pantoprazole (PROTONIX) 40 MG tablet Take 1 tablet (40 mg total) by mouth daily.  30 tablet  3  .  polyethylene glycol powder (GLYCOLAX/MIRALAX) powder Take 17 g by mouth 2 (two) times daily. Until daily soft stools  OTC  255 g  0  . POTASSIUM GLUCONATE PO Take 595 mg by mouth daily.        Facility-Administered Encounter Medications as of 02/10/2012  Medication Dose Route Frequency Provider Last Rate Last Dose  . influenza  inactive virus vaccine (FLUZONE/FLUARIX) injection 0.5 mL  0.5 mL Intramuscular Once Shelia Media, MD       BP 140/80  Pulse 74  Temp 98.6 F (37 C) (Oral)  Wt 90 lb (40.824 kg)  SpO2 98%  Review of Systems  Constitutional: Negative for fever, chills, appetite change, fatigue and unexpected weight change.  HENT: Positive for trouble swallowing. Negative for ear pain, congestion, sore throat, neck pain, voice change and sinus  pressure.   Eyes: Negative for visual disturbance.  Respiratory: Negative for cough, shortness of breath, wheezing and stridor.   Cardiovascular: Negative for chest pain, palpitations and leg swelling.  Gastrointestinal: Positive for constipation. Negative for nausea, vomiting, abdominal pain, diarrhea, blood in stool, abdominal distention and anal bleeding.  Genitourinary: Negative for dysuria and flank pain.  Musculoskeletal: Positive for joint swelling and arthralgias. Negative for myalgias and gait problem.  Skin: Negative for color change and rash.  Neurological: Negative for dizziness and headaches.  Hematological: Negative for adenopathy. Does not bruise/bleed easily.  Psychiatric/Behavioral: Negative for suicidal ideas, disturbed wake/sleep cycle and dysphoric mood. The patient is not nervous/anxious.        Objective:   Physical Exam  Constitutional: She is oriented to person, place, and time. She appears well-developed and well-nourished. No distress.  HENT:  Head: Normocephalic and atraumatic.  Right Ear: External ear normal.  Left Ear: External ear normal.  Nose: Nose normal.  Mouth/Throat: Oropharynx is clear and moist. No oropharyngeal exudate.  Eyes: Conjunctivae normal are normal. Pupils are equal, round, and reactive to light. Right eye exhibits no discharge. Left eye exhibits no discharge. No scleral icterus.  Neck: Normal range of motion. Neck supple. No tracheal deviation present. No thyromegaly present.  Cardiovascular: Normal rate, regular rhythm, normal heart sounds and intact distal pulses.  Exam reveals no gallop and no friction rub.   No murmur heard. Pulmonary/Chest: Effort normal and breath sounds normal. No respiratory distress. She has no wheezes. She has no rales. She exhibits no tenderness.  Abdominal: Soft. Bowel sounds are normal. She exhibits no distension and no mass. There is no tenderness. There is no rebound and no guarding.  Musculoskeletal: Normal  range of motion. She exhibits no edema and no tenderness.       Right knee: She exhibits normal range of motion and no swelling. no tenderness found.  Lymphadenopathy:    She has no cervical adenopathy.  Neurological: She is alert and oriented to person, place, and time. No cranial nerve deficit. She exhibits normal muscle tone. Coordination normal.  Skin: Skin is warm and dry. No rash noted. She is not diaphoretic. No erythema. No pallor.  Psychiatric: She has a normal mood and affect. Her behavior is normal. Judgment and thought content normal.          Assessment & Plan:

## 2012-02-10 NOTE — Assessment & Plan Note (Signed)
Symptoms consistent with upper esophageal dysmotility. Swallow study with speech pathology pending through home health. Will follow.

## 2012-02-10 NOTE — Assessment & Plan Note (Signed)
Some persistent belching. Will try increasing frequency of miralax to bid and adding simethicone to help with symptoms.  Pt son will call if no improvement.  Note that testing for H. Pylori 01/2012 was negative. Follow up 03/2012 and prn.

## 2012-02-10 NOTE — Assessment & Plan Note (Signed)
Symptoms improving with avoiding weight bearing activity. Pt has follow up with ortho to re-evaluate right tibial fracture next week. Continue prn hydrocodone, however we discussed limiting use to help with constipation.

## 2012-02-10 NOTE — Assessment & Plan Note (Signed)
Symptoms improved after enema x 2 in ED and with use of Miralax, however still having some "pasty" hard stools. Will increase Miralax to bid. Follow up in 03/2012 and prn.

## 2012-02-12 ENCOUNTER — Ambulatory Visit: Payer: Medicare Other | Admitting: Internal Medicine

## 2012-02-15 ENCOUNTER — Telehealth: Payer: Self-pay | Admitting: Internal Medicine

## 2012-02-15 NOTE — Telephone Encounter (Signed)
That is fine 

## 2012-02-15 NOTE — Telephone Encounter (Signed)
Jenny Burns called about Jenny Burns Needs verbal order for speech therapy for swallowing difficulty

## 2012-02-15 NOTE — Telephone Encounter (Signed)
Left message on cell phone voicemail advising Denny Peon as instructed.

## 2012-03-01 ENCOUNTER — Telehealth: Payer: Self-pay | Admitting: Internal Medicine

## 2012-03-01 ENCOUNTER — Emergency Department: Payer: Self-pay | Admitting: Emergency Medicine

## 2012-03-01 LAB — TROPONIN I: Troponin-I: 0.02 ng/mL

## 2012-03-01 LAB — COMPREHENSIVE METABOLIC PANEL
Alkaline Phosphatase: 131 U/L (ref 50–136)
Anion Gap: 10 (ref 7–16)
BUN: 12 mg/dL (ref 7–18)
Bilirubin,Total: 0.6 mg/dL (ref 0.2–1.0)
Calcium, Total: 8.5 mg/dL (ref 8.5–10.1)
Chloride: 99 mmol/L (ref 98–107)
Co2: 27 mmol/L (ref 21–32)
Creatinine: 0.72 mg/dL (ref 0.60–1.30)
EGFR (African American): >60
EGFR (Non-African Amer.): >60
Potassium: 3.6 mmol/L (ref 3.5–5.1)
SGOT(AST): 23 U/L (ref 15–37)
Total Protein: 6.1 g/dL — ABNORMAL LOW (ref 6.4–8.2)

## 2012-03-01 LAB — PROTIME-INR
INR: 1
Prothrombin Time: 13.3 secs (ref 11.5–14.7)

## 2012-03-01 LAB — URINALYSIS, COMPLETE
Ketone: NEGATIVE
Leukocyte Esterase: NEGATIVE
Nitrite: NEGATIVE
Protein: NEGATIVE
RBC,UR: 7 /HPF (ref 0–5)
WBC UR: <1 /HPF (ref 0–5)

## 2012-03-01 LAB — CBC WITH DIFFERENTIAL/PLATELET
Basophil #: 0 10*3/uL (ref 0.0–0.1)
Eosinophil %: 0 %
HCT: 38.2 % (ref 35.0–47.0)
HGB: 12.8 g/dL (ref 12.0–16.0)
Lymphocyte #: 0.7 10*3/uL — ABNORMAL LOW (ref 1.0–3.6)
Lymphocyte %: 7.4 %
MCH: 31.4 pg (ref 26.0–34.0)
Monocyte #: 0.5 x10 3/mm (ref 0.2–0.9)
Neutrophil #: 8.4 10*3/uL — ABNORMAL HIGH (ref 1.4–6.5)
Neutrophil %: 86.8 %
Platelet: 173 10*3/uL (ref 150–440)
RBC: 4.08 10*6/uL (ref 3.80–5.20)

## 2012-03-01 LAB — TSH: Thyroid Stimulating Horm: 2.15 u[IU]/mL

## 2012-03-01 LAB — CK TOTAL AND CKMB (NOT AT ARMC): CK-MB: 1.1 ng/mL (ref 0.5–3.6)

## 2012-03-01 LAB — APTT: Activated PTT: 29.7 secs (ref 23.6–35.9)

## 2012-03-01 NOTE — Telephone Encounter (Signed)
Patient Information:  Caller Name: [name]  Phone: [phone]  Patient: Jenny Burns  Gender: Female  DOB: 06-07-1916  Age: 78 Years  PCP: Ronna Polio (Adults only)   Symptoms  Reason For Call & Symptoms: Pulse in 80s baseline 60's; weak; nauseated. Urine not dark. Severe pain in the back; clammy skin.  Reviewed Health History In EMR: No  Reviewed Medications In EMR: No  Reviewed Allergies In EMR: No  Date of Onset of Symptoms: 03/01/2012  Guideline(s) Used:  Weakness (Generalized) and Fatigue  Disposition Per Guideline:   Call EMS 911 Now  Reason For Disposition Reached:   Shock suspected (e.g., cold/pale/clammy skin, too weak to stand)  Advice Given:  N/A  Office Follow Up:  Does the office need to follow up with this patient?: No  Instructions For The Office: N/A  RN Note:  Instructed to take meds w/ him to hospital. Going to [facility].

## 2012-03-01 NOTE — Telephone Encounter (Signed)
Can you check to see that she was evaluated at [facility]?

## 2012-03-03 LAB — URINE CULTURE

## 2012-03-08 NOTE — Telephone Encounter (Signed)
No answer

## 2012-03-16 ENCOUNTER — Ambulatory Visit (INDEPENDENT_AMBULATORY_CARE_PROVIDER_SITE_OTHER): Payer: Medicare Other | Admitting: Internal Medicine

## 2012-03-16 ENCOUNTER — Encounter: Payer: Self-pay | Admitting: Internal Medicine

## 2012-03-16 VITALS — BP 152/80 | HR 91 | Temp 97.6°F | Resp 15 | Wt 86.8 lb

## 2012-03-16 DIAGNOSIS — D649 Anemia, unspecified: Secondary | ICD-10-CM

## 2012-03-16 DIAGNOSIS — N390 Urinary tract infection, site not specified: Secondary | ICD-10-CM

## 2012-03-16 DIAGNOSIS — M1990 Unspecified osteoarthritis, unspecified site: Secondary | ICD-10-CM

## 2012-03-16 DIAGNOSIS — R634 Abnormal weight loss: Secondary | ICD-10-CM

## 2012-03-16 DIAGNOSIS — Z1331 Encounter for screening for depression: Secondary | ICD-10-CM

## 2012-03-16 LAB — CBC WITH DIFFERENTIAL/PLATELET
Basophils Absolute: 0 10*3/uL (ref 0.0–0.1)
Eosinophils Relative: 1.5 % (ref 0.0–5.0)
Lymphocytes Relative: 32.4 % (ref 12.0–46.0)
Monocytes Relative: 7.3 % (ref 3.0–12.0)
Platelets: 184 10*3/uL (ref 150.0–400.0)
RDW: 14.7 % — ABNORMAL HIGH (ref 11.5–14.6)
WBC: 4.4 10*3/uL — ABNORMAL LOW (ref 4.5–10.5)

## 2012-03-16 LAB — COMPREHENSIVE METABOLIC PANEL
ALT: 9 U/L (ref 0–35)
CO2: 28 mEq/L (ref 19–32)
Calcium: 9.3 mg/dL (ref 8.4–10.5)
Chloride: 100 mEq/L (ref 96–112)
Creatinine, Ser: 0.7 mg/dL (ref 0.4–1.2)
GFR: 82.92 mL/min (ref >60.00)
Glucose, Bld: 97 mg/dL (ref 70–99)
Sodium: 137 mEq/L (ref 135–145)
Total Protein: 7 g/dL (ref 6.0–8.3)

## 2012-03-16 LAB — TSH: TSH: 3.08 u[IU]/mL (ref 0.35–5.50)

## 2012-03-16 LAB — POCT URINALYSIS DIPSTICK
Glucose, UA: NEGATIVE
Leukocytes, UA: NEGATIVE
Nitrite, UA: NEGATIVE
Urobilinogen, UA: 1

## 2012-03-16 MED ORDER — PREDNISONE 10 MG PO TABS: 10.0000 mg | ORAL_TABLET | Freq: Every day | ORAL | Status: DC

## 2012-03-16 MED ORDER — HYDROCODONE-ACETAMINOPHEN 5-325 MG PO TABS: 1.0000 | ORAL_TABLET | Freq: Four times a day (QID) | ORAL | Status: DC | PRN

## 2012-03-16 MED ORDER — CIPROFLOXACIN HCL 250 MG PO TABS: 250.0000 mg | ORAL_TABLET | Freq: Two times a day (BID) | ORAL | Status: DC

## 2012-03-16 NOTE — Assessment & Plan Note (Signed)
Patient brought in urine sample today. She is asymptomatic except for left lower back pain. Urinalysis was remarkable for trace blood. Will sent for culture. Will treat empirically with Cipro. If symptoms of left lower back pain are persisting, would favor getting CT without contrast to look for nephrolithiasis.

## 2012-03-16 NOTE — Assessment & Plan Note (Signed)
4 pound weight loss noted over the last month. Patient reports appetite is excellent and her son reports po intake is excellent. Question of weight loss may be related to fluid. We'll also check labs including CBC, CMP, TSH. We'll plan to have her return to clinic in one month for weight recheck.

## 2012-03-16 NOTE — Addendum Note (Signed)
Addended by: Jackson Latino on: 03/16/2012 03:23 PM   Modules accepted: Orders

## 2012-03-16 NOTE — Assessment & Plan Note (Signed)
Patient was noted to have mild anemia on labs performed one month ago. We'll plan to recheck CBC today.

## 2012-03-16 NOTE — Assessment & Plan Note (Signed)
Patient with diffuse osteoarthritis and osteoporosis of her spine with compression fractures at T11 and L2. Patient has some chronic pain because of this. Now having some pain which radiates to her left lower back. She has tried Lidoderm patch in the past but has been unable to tolerate because of topical skin irritation. We'll try using a short course of prednisone to help with inflammation and pain. Will also increase her dose of hydrocodone from 2.5 mg to 5 mg up to 4 times daily as needed. Patient will call if symptoms are not improving.

## 2012-03-16 NOTE — Progress Notes (Signed)
Subjective:    Patient ID: Jenny Burns, female    DOB: Sep 01, 1916, 78 y.o.   MRN: [id number]  HPI 78 year old female with history of recurrent urinary tract infections, hypertension, hyperlipidemia, hypothyroidism, and chronic back pain secondary to degenerative osteoarthritis and compression fractures presents for followup. She reports she is generally been doing well. At her last visit in October she was seen for constipation and impaction. She reports that symptoms have resolved. She is taking MiraLax daily with regular bowel movements. She reports that her appetite is excellent. Her son reports that her intake of food has been excellent. She does have chronic back pain which she reports is worse with prolonged sitting. It is also worse in the cooler temperatures. She has been using half tablet of hydrocodone up to twice daily with some improvement in pain. Her son reports that she has used Lidoderm patch in the past but this irritated her skin. She reports that occasionally the pain radiates to her left lower back. She denies any shortness of breath or increase in pain with deep inspiration. Pain is alleviated by change in position.   Outpatient Encounter Prescriptions as of 03/16/2012  Medication Sig Dispense Refill  . amLODipine (NORVASC) 5 MG tablet Take 1 tablet (5 mg total) by mouth daily.  90 tablet  3  . aspirin EC 81 MG tablet Take 81 mg by mouth daily.      Marland Kitchen atorvastatin (LIPITOR) 10 MG tablet Take 10 mg by mouth at bedtime.      . cholecalciferol (VITAMIN D) 1000 UNITS tablet Take 1,000 Units by mouth daily.      . cyanocobalamin 2000 MCG tablet Take 2,000 mcg by mouth daily.      . digoxin (LANOXIN) 0.125 MG tablet Take 0.125 mg by mouth daily.      Marland Kitchen docusate sodium (DULCOLAX) 100 MG capsule Take 100 mg by mouth 2 (two) times daily as needed. For stool softner      . Ferrous Sulfate (IRON) 325 (65 FE) MG TABS Take 325 mg by mouth daily.       Marland Kitchen HYDROcodone-acetaminophen (VICODIN)  5-500 MG per tablet Take 0.5 tablets by mouth 3 (three) times daily.      . isosorbide mononitrate (IMDUR) 30 MG 24 hr tablet Take 30 mg by mouth at bedtime.      Marland Kitchen lactulose (CHRONULAC) 10 GM/15ML solution Take 30 mLs (20 g total) by mouth 3 (three) times daily. Stop after bowel movement.  240 mL  0  . levothyroxine (SYNTHROID, LEVOTHROID) 50 MCG tablet Take 50 mcg by mouth daily.      Marland Kitchen lisinopril (PRINIVIL,ZESTRIL) 20 MG tablet Take 20 mg by mouth at bedtime.      . Multiple Vitamin (MULTIVITAMINS PO) Take 1 tablet by mouth daily.        . ondansetron (ZOFRAN-ODT) 8 MG disintegrating tablet Take 1 tablet (8 mg total) by mouth every 8 (eight) hours as needed for nausea.  20 tablet  0  . pantoprazole (PROTONIX) 40 MG tablet Take 1 tablet (40 mg total) by mouth daily.  30 tablet  3  . polyethylene glycol powder (GLYCOLAX/MIRALAX) powder Take 17 g by mouth 2 (two) times daily. Until daily soft stools  OTC  255 g  0  . POTASSIUM GLUCONATE PO Take 595 mg by mouth daily.       Marland Kitchen HYDROcodone-acetaminophen (NORCO/VICODIN) 5-325 MG per tablet Take 1 tablet by mouth every 6 (six) hours as needed for pain.  90 tablet  3  . predniSONE (DELTASONE) 10 MG tablet Take 1 tablet (10 mg total) by mouth daily.  5 tablet  0   Facility-Administered Encounter Medications as of 03/16/2012  Medication Dose Route Frequency Provider Last Rate Last Dose  . influenza  inactive virus vaccine (FLUZONE/FLUARIX) injection 0.5 mL  0.5 mL Intramuscular Once Shelia Media, MD       BP 152/80  Pulse 91  Temp 97.6 F (36.4 C) (Oral)  Resp 15  Wt 86 lb 12 oz (39.35 kg)  Review of Systems  Constitutional: Negative for fever, chills, appetite change, fatigue and unexpected weight change.  HENT: Negative for ear pain, congestion, sore throat, trouble swallowing, neck pain, voice change and sinus pressure.   Eyes: Negative for visual disturbance.  Respiratory: Negative for cough, shortness of breath, wheezing and stridor.     Cardiovascular: Negative for chest pain, palpitations and leg swelling.  Gastrointestinal: Negative for nausea, vomiting, abdominal pain, diarrhea, constipation, blood in stool, abdominal distention and anal bleeding.  Genitourinary: Negative for dysuria, urgency, frequency, hematuria and flank pain.  Musculoskeletal: Positive for myalgias, back pain and arthralgias. Negative for gait problem.  Skin: Negative for color change and rash.  Neurological: Negative for dizziness and headaches.  Hematological: Negative for adenopathy. Does not bruise/bleed easily.  Psychiatric/Behavioral: Negative for suicidal ideas, sleep disturbance and dysphoric mood. The patient is not nervous/anxious.        Objective:   Physical Exam  Constitutional: She is oriented to person, place, and time. She appears well-developed and well-nourished. No distress.  HENT:  Head: Normocephalic and atraumatic.  Right Ear: External ear normal.  Left Ear: External ear normal.  Nose: Nose normal.  Mouth/Throat: Oropharynx is clear and moist. No oropharyngeal exudate.  Eyes: Conjunctivae normal are normal. Pupils are equal, round, and reactive to light. Right eye exhibits no discharge. Left eye exhibits no discharge. No scleral icterus.  Neck: Normal range of motion. Neck supple. No tracheal deviation present. No thyromegaly present.  Cardiovascular: Normal rate, regular rhythm, normal heart sounds and intact distal pulses.  Exam reveals no gallop and no friction rub.   No murmur heard. Pulmonary/Chest: Effort normal and breath sounds normal. No respiratory distress. She has no wheezes. She has no rales. She exhibits no tenderness.  Musculoskeletal: She exhibits no edema and no tenderness.       Thoracic back: She exhibits decreased range of motion, tenderness, bony tenderness and pain.       Lumbar back: She exhibits decreased range of motion, tenderness, bony tenderness and pain.       Back:  Lymphadenopathy:    She  has no cervical adenopathy.  Neurological: She is alert and oriented to person, place, and time. No cranial nerve deficit. She exhibits normal muscle tone. Coordination normal.  Skin: Skin is warm and dry. No rash noted. She is not diaphoretic. No erythema. No pallor.  Psychiatric: She has a normal mood and affect. Her behavior is normal. Judgment and thought content normal.          Assessment & Plan:

## 2012-03-18 LAB — URINE CULTURE
Colony Count: NO GROWTH
Organism ID, Bacteria: NO GROWTH

## 2012-03-28 ENCOUNTER — Ambulatory Visit (INDEPENDENT_AMBULATORY_CARE_PROVIDER_SITE_OTHER): Payer: Medicare Other | Admitting: Cardiovascular Disease

## 2012-03-28 ENCOUNTER — Encounter: Payer: Self-pay | Admitting: Cardiovascular Disease

## 2012-03-28 VITALS — BP 126/62 | HR 72 | Ht <= 58 in | Wt 88.5 lb

## 2012-03-28 DIAGNOSIS — I10 Essential (primary) hypertension: Secondary | ICD-10-CM

## 2012-03-28 DIAGNOSIS — I4891 Unspecified atrial fibrillation: Secondary | ICD-10-CM

## 2012-03-28 DIAGNOSIS — I739 Peripheral vascular disease, unspecified: Secondary | ICD-10-CM

## 2012-03-28 DIAGNOSIS — E785 Hyperlipidemia, unspecified: Secondary | ICD-10-CM

## 2012-03-28 NOTE — Progress Notes (Signed)
Patient ID: Jenny Burns, female    DOB: 23-Sep-1916, 78 y.o.   MRN: [id number]  HPI Comments: 78 year old woman with a history of frequent falls, traumatic injury to her shoulder requiring surgery, also history of bladder and hysterectomy surgery in 2006 with long hospital course, recent onset of atrial fibrillation Not on anticoagulation as she is a high fall risk candidate, hypertension who presents for routine followup. She has a hx of edema.  Metoprolol has been held in the past secondary to bradycardia.  Overall she feels well though she does continue to have chronic back pain. No recent falls over the past 2 years. She has chronic urinary tract infections, chronic constipation, improved on MiraLAX. She reports having a tibia fracture September 2013 after weightbearing on her leg. Medical management was recommended with no significant intervention.   She had lower extremity ABIs that showed significant disease bilaterally. She has requested medical management She has a history of back surgery after breaking her back in 2007. She currently lives with her son who helps take care of her. She is unable to climb into the bathtub.  EKG shows atrial fibrillation with rate of 72 beats per minute, no other significant ST or T wave changes    Outpatient Encounter Prescriptions as of 03/28/2012  Medication Sig Dispense Refill  . amLODipine (NORVASC) 5 MG tablet Take 1 tablet (5 mg total) by mouth daily.  90 tablet  3  . aspirin EC 81 MG tablet Take 81 mg by mouth daily.      Marland Kitchen atorvastatin (LIPITOR) 10 MG tablet Take 10 mg by mouth at bedtime.      . cholecalciferol (VITAMIN D) 1000 UNITS tablet Take 1,000 Units by mouth daily.      . cyanocobalamin 2000 MCG tablet Take 2,000 mcg by mouth daily.      . digoxin (LANOXIN) 0.125 MG tablet Take 0.125 mg by mouth daily.      Marland Kitchen docusate sodium (DULCOLAX) 100 MG capsule Take 100 mg by mouth 2 (two) times daily as needed. For stool softner      . Ferrous  Sulfate (IRON) 325 (65 FE) MG TABS Take 325 mg by mouth daily.       Marland Kitchen HYDROcodone-acetaminophen (NORCO/VICODIN) 5-325 MG per tablet Take 1 tablet by mouth every 6 (six) hours as needed for pain.  90 tablet  3  . isosorbide mononitrate (IMDUR) 30 MG 24 hr tablet Take 30 mg by mouth at bedtime.      Marland Kitchen lactulose (CHRONULAC) 10 GM/15ML solution Take 30 mLs (20 g total) by mouth 3 (three) times daily. Stop after bowel movement.  240 mL  0  . levothyroxine (SYNTHROID, LEVOTHROID) 50 MCG tablet Take 50 mcg by mouth daily.      Marland Kitchen lisinopril (PRINIVIL,ZESTRIL) 20 MG tablet Take 20 mg by mouth at bedtime.      . Multiple Vitamin (MULTIVITAMINS PO) Take 1 tablet by mouth daily.        . pantoprazole (PROTONIX) 40 MG tablet Take 1 tablet (40 mg total) by mouth daily.  30 tablet  3  . polyethylene glycol powder (GLYCOLAX/MIRALAX) powder Take 17 g by mouth 2 (two) times daily. Until daily soft stools  OTC  255 g  0  . POTASSIUM GLUCONATE PO Take 595 mg by mouth daily.         Review of Systems  HENT: Negative.   Eyes: Negative.   Respiratory: Negative.   Cardiovascular: Negative.  Cold discolored feet bilaterally  Gastrointestinal: Negative.   Musculoskeletal: Positive for back pain, arthralgias and gait problem.  Skin: Negative.   Neurological: Positive for weakness.  Hematological: Negative.   Psychiatric/Behavioral: Negative.   All other systems reviewed and are negative.    BP 126/62  Pulse 72  Ht 4\' 10"  (1.473 m)  Wt 88 lb 8 oz (40.143 kg)  BMI 18.50 kg/m2  Physical Exam  Nursing note and vitals reviewed. Constitutional: She is oriented to person, place, and time. She appears well-developed and well-nourished.       Sitting in a wheelchair, thin.  HENT:  Head: Normocephalic.  Nose: Nose normal.  Mouth/Throat: Oropharynx is clear and moist.  Eyes: Conjunctivae normal are normal. Pupils are equal, round, and reactive to light.  Neck: Normal range of motion. Neck supple. No  JVD present.  Cardiovascular: Normal rate, S1 normal, S2 normal and intact distal pulses.  An irregularly irregular rhythm present. Exam reveals decreased pulses. Exam reveals no gallop and no friction rub.   Murmur heard.  Crescendo systolic murmur is present with a grade of 1/6  Pulses:      Carotid pulses are 2+ on the right side, and 2+ on the left side.      Radial pulses are 2+ on the right side, and 2+ on the left side.       Dorsalis pedis pulses are 0 on the right side, and 0 on the left side.       Posterior tibial pulses are 0 on the right side, and 0 on the left side.  Pulmonary/Chest: Effort normal and breath sounds normal. No respiratory distress. She has no wheezes. She has no rales. She exhibits no tenderness.  Abdominal: Soft. Bowel sounds are normal. She exhibits no distension. There is no tenderness.  Musculoskeletal: Normal range of motion. She exhibits no edema and no tenderness.  Lymphadenopathy:    She has no cervical adenopathy.  Neurological: She is alert and oriented to person, place, and time. Coordination normal.  Skin: Skin is warm and dry. No rash noted. No erythema.  Psychiatric: She has a normal mood and affect. Her behavior is normal. Judgment and thought content normal.         Assessment and Plan

## 2012-03-28 NOTE — Patient Instructions (Addendum)
You are doing well. No medication changes were made.  Please call us if you have new issues that need to be addressed before your next appt.  Your physician wants you to follow-up in: 6 months.  You will receive a reminder letter in the mail two months in advance. If you don't receive a letter, please call our office to schedule the follow-up appointment.   

## 2012-03-28 NOTE — Assessment & Plan Note (Signed)
Blood pressure is well controlled on today's visit. No changes made to the medications. 

## 2012-03-28 NOTE — Assessment & Plan Note (Signed)
She has requested medical management.

## 2012-03-28 NOTE — Assessment & Plan Note (Addendum)
No recent lipid panel. We have suggested she stay on her statin.

## 2012-03-28 NOTE — Assessment & Plan Note (Addendum)
Metoprolol held in the past. Rate is relatively well controlled. She is relatively sedentary. Not a good candidate for anticoagulation.

## 2012-04-20 ENCOUNTER — Ambulatory Visit (INDEPENDENT_AMBULATORY_CARE_PROVIDER_SITE_OTHER): Payer: Medicare Other | Admitting: Internal Medicine

## 2012-04-20 ENCOUNTER — Encounter: Payer: Self-pay | Admitting: Internal Medicine

## 2012-04-20 VITALS — BP 130/86 | HR 66 | Temp 97.5°F | Wt 85.0 lb

## 2012-04-20 DIAGNOSIS — R634 Abnormal weight loss: Secondary | ICD-10-CM

## 2012-04-20 DIAGNOSIS — M1990 Unspecified osteoarthritis, unspecified site: Secondary | ICD-10-CM

## 2012-04-20 DIAGNOSIS — K5900 Constipation, unspecified: Secondary | ICD-10-CM

## 2012-04-20 NOTE — Progress Notes (Signed)
Subjective:    Patient ID: Jenny Burns, female    DOB: 1917-01-05, 79 y.o.   MRN: [id number]  HPI 79 year old female with history of osteoarthritis, hypertension, hyperlipidemia presents for followup. She reports she is doing much better than previous. Pain is well-controlled with current medications. She only occasionally uses hydrocodone for pain. She also reports that constipation is improved with use of daily MiraLax. Her son reports that her appetite has been excellent. They have no new concerns today.   Outpatient Encounter Prescriptions as of 04/20/2012  Medication Sig Dispense Refill  . amLODipine (NORVASC) 5 MG tablet Take 1 tablet (5 mg total) by mouth daily.  90 tablet  3  . aspirin EC 81 MG tablet Take 81 mg by mouth daily.      Marland Kitchen atorvastatin (LIPITOR) 10 MG tablet Take 10 mg by mouth at bedtime.      . cholecalciferol (VITAMIN D) 1000 UNITS tablet Take 1,000 Units by mouth daily.      . cyanocobalamin 2000 MCG tablet Take 2,000 mcg by mouth daily.      . digoxin (LANOXIN) 0.125 MG tablet Take 0.125 mg by mouth daily.      Marland Kitchen docusate sodium (DULCOLAX) 100 MG capsule Take 100 mg by mouth 2 (two) times daily as needed. For stool softner      . Ferrous Sulfate (IRON) 325 (65 FE) MG TABS Take 325 mg by mouth daily.       Marland Kitchen HYDROcodone-acetaminophen (NORCO/VICODIN) 5-325 MG per tablet Take 1 tablet by mouth every 6 (six) hours as needed for pain.  90 tablet  3  . isosorbide mononitrate (IMDUR) 30 MG 24 hr tablet Take 30 mg by mouth at bedtime.      Marland Kitchen lactulose (CHRONULAC) 10 GM/15ML solution Take 30 mLs (20 g total) by mouth 3 (three) times daily. Stop after bowel movement.  240 mL  0  . levothyroxine (SYNTHROID, LEVOTHROID) 50 MCG tablet Take 50 mcg by mouth daily.      Marland Kitchen lisinopril (PRINIVIL,ZESTRIL) 20 MG tablet Take 20 mg by mouth at bedtime.      . Multiple Vitamin (MULTIVITAMINS PO) Take 1 tablet by mouth daily.        . pantoprazole (PROTONIX) 40 MG tablet Take 1 tablet (40 mg  total) by mouth daily.  30 tablet  3  . polyethylene glycol powder (GLYCOLAX/MIRALAX) powder Take 17 g by mouth 2 (two) times daily. Until daily soft stools  OTC  255 g  0  . POTASSIUM GLUCONATE PO Take 595 mg by mouth daily.          BP 130/86  Pulse 66  Temp 97.5 F (36.4 C) (Oral)  Wt 85 lb (38.556 kg)  SpO2 98%   Review of Systems  Constitutional: Negative for fever, chills, appetite change, fatigue and unexpected weight change.  HENT: Negative for ear pain, congestion, sore throat, trouble swallowing, neck pain, voice change and sinus pressure.   Eyes: Negative for visual disturbance.  Respiratory: Negative for cough, shortness of breath, wheezing and stridor.   Cardiovascular: Negative for chest pain, palpitations and leg swelling.  Gastrointestinal: Positive for constipation. Negative for nausea, vomiting, abdominal pain, diarrhea, blood in stool, abdominal distention and anal bleeding.  Genitourinary: Negative for dysuria and flank pain.  Musculoskeletal: Positive for myalgias, back pain and arthralgias. Negative for gait problem.  Skin: Negative for color change and rash.  Neurological: Negative for dizziness and headaches.  Hematological: Negative for adenopathy. Does not bruise/bleed easily.  Psychiatric/Behavioral: Negative for suicidal ideas, sleep disturbance and dysphoric mood. The patient is not nervous/anxious.        Objective:   Physical Exam  Constitutional: She is oriented to person, place, and time. She appears well-developed and well-nourished. No distress.  HENT:  Head: Normocephalic and atraumatic.  Right Ear: External ear normal.  Left Ear: External ear normal.  Nose: Nose normal.  Mouth/Throat: Oropharynx is clear and moist. No oropharyngeal exudate.  Eyes: Conjunctivae normal are normal. Pupils are equal, round, and reactive to light. Right eye exhibits no discharge. Left eye exhibits no discharge. No scleral icterus.  Neck: Normal range of  motion. Neck supple. No tracheal deviation present. No thyromegaly present.  Cardiovascular: Normal rate, regular rhythm, normal heart sounds and intact distal pulses.  Exam reveals no gallop and no friction rub.   No murmur heard. Pulmonary/Chest: Effort normal and breath sounds normal. No respiratory distress. She has no wheezes. She has no rales. She exhibits no tenderness.  Abdominal: Soft. Bowel sounds are normal. She exhibits no distension.  Musculoskeletal: She exhibits no edema and no tenderness.       Thoracic back: She exhibits decreased range of motion and pain.       Lumbar back: She exhibits decreased range of motion and pain.  Lymphadenopathy:    She has no cervical adenopathy.  Neurological: She is alert and oriented to person, place, and time. No cranial nerve deficit. She exhibits normal muscle tone. Coordination normal.  Skin: Skin is warm and dry. No rash noted. She is not diaphoretic. No erythema. No pallor.  Psychiatric: She has a normal mood and affect. Her behavior is normal. Judgment and thought content normal.          Assessment & Plan:

## 2012-04-20 NOTE — Assessment & Plan Note (Addendum)
Wt Readings from Last 3 Encounters:  04/20/12 85 lb (38.556 kg)  03/28/12 88 lb 8 oz (40.143 kg)  03/16/12 86 lb 12 oz (39.35 kg)     Weight is down another 3 pounds. However, patient reports appetite has improved, and weight fairly stable over last last 18months on review of chart. Encouraged her and her son to supplement her diet with Ensure one to 2 cans per day. We'll plan to repeat weight check in 3 months.

## 2012-04-20 NOTE — Assessment & Plan Note (Signed)
Symptoms are much better controlled with daily MiraLax. Will continue.

## 2012-04-20 NOTE — Assessment & Plan Note (Signed)
Chronic pain from osteoarthritis is well-controlled with current medications. Will continue. Followup 3 months.

## 2012-05-17 ENCOUNTER — Other Ambulatory Visit: Payer: Self-pay | Admitting: Internal Medicine

## 2012-06-21 ENCOUNTER — Encounter: Payer: Self-pay | Admitting: Adult Health

## 2012-06-21 ENCOUNTER — Ambulatory Visit (INDEPENDENT_AMBULATORY_CARE_PROVIDER_SITE_OTHER): Payer: Medicare Other | Admitting: Adult Health

## 2012-06-21 VITALS — BP 152/66 | HR 67 | Temp 98.2°F | Resp 16 | Wt 85.0 lb

## 2012-06-21 DIAGNOSIS — J020 Streptococcal pharyngitis: Secondary | ICD-10-CM

## 2012-06-21 DIAGNOSIS — H6123 Impacted cerumen, bilateral: Secondary | ICD-10-CM | POA: Insufficient documentation

## 2012-06-21 DIAGNOSIS — H6120 Impacted cerumen, unspecified ear: Secondary | ICD-10-CM

## 2012-06-21 DIAGNOSIS — R1310 Dysphagia, unspecified: Secondary | ICD-10-CM

## 2012-06-21 DIAGNOSIS — J029 Acute pharyngitis, unspecified: Secondary | ICD-10-CM | POA: Insufficient documentation

## 2012-06-21 MED ORDER — AZITHROMYCIN 250 MG PO TABS: ORAL_TABLET | ORAL | Status: DC

## 2012-06-21 NOTE — Assessment & Plan Note (Signed)
Patient has a history of dysphasia. Now presents with a sore throat which is worsening problem. She reports difficulty swallowing her medications. I have advised her to try to take her medication with pudding. Consider referral to speech therapy if symptoms persist after treating her sore throat. Patient, at 62, does not wish extensive workup. She is agreeable for evaluation, if needed, for recommendations of how to improve her swallowing.

## 2012-06-21 NOTE — Assessment & Plan Note (Signed)
Elderly female with sore throat since Thursday. She is having difficulty swallowing especially her medications. I have asked her to try to take her medications with pudding since she does not like applesauce. I am starting her on azithromycin. I have discussed referral to Speech if her trouble swallowing remains after completing antibiotic.

## 2012-06-21 NOTE — Patient Instructions (Addendum)
  I am starting you on an antibiotic for your sore throat.  After you are done with your antibiotic, if your swallowing is still difficult, I will refer you to a Speech Pathologist for evaluation.  You can try to take all you medications with pudding if this will make them easier for you to swallow.

## 2012-06-21 NOTE — Progress Notes (Signed)
Subjective:    Patient ID: Jenny Burns, female    DOB: February 01, 1917, 79 y.o.   MRN: [id number]  HPI  Patient is a pleasant 79 y/o female who presents to clinic today with c/o sore throat. Symptoms began on Thursday. She reports coughing. The coughing tends to be after eating or swallowing pills. Patient has a history of dysphagia and has seen a Doctor, general practice in the past. Denies fever, chills, shortness of breath or chest pain.   Current Outpatient Prescriptions on File Prior to Visit  Medication Sig Dispense Refill  . amLODipine (NORVASC) 5 MG tablet Take 1 tablet (5 mg total) by mouth daily.  90 tablet  3  . aspirin EC 81 MG tablet Take 81 mg by mouth daily.      Marland Kitchen atorvastatin (LIPITOR) 10 MG tablet Take 10 mg by mouth at bedtime.      . cholecalciferol (VITAMIN D) 1000 UNITS tablet Take 1,000 Units by mouth daily.      . cyanocobalamin 2000 MCG tablet Take 2,000 mcg by mouth daily.      . digoxin (LANOXIN) 0.125 MG tablet Take 0.125 mg by mouth daily.      Marland Kitchen docusate sodium (DULCOLAX) 100 MG capsule Take 100 mg by mouth 2 (two) times daily as needed. For stool softner      . HYDROcodone-acetaminophen (NORCO/VICODIN) 5-325 MG per tablet Take 1 tablet by mouth every 6 (six) hours as needed for pain.  90 tablet  3  . isosorbide mononitrate (IMDUR) 30 MG 24 hr tablet Take 30 mg by mouth at bedtime.      Marland Kitchen lactulose (CHRONULAC) 10 GM/15ML solution Take 30 mLs (20 g total) by mouth 3 (three) times daily. Stop after bowel movement.  240 mL  0  . levothyroxine (SYNTHROID, LEVOTHROID) 50 MCG tablet Take 50 mcg by mouth daily.      Marland Kitchen lisinopril (PRINIVIL,ZESTRIL) 20 MG tablet Take 20 mg by mouth at bedtime.      . Multiple Vitamin (MULTIVITAMINS PO) Take 1 tablet by mouth daily.        . pantoprazole (PROTONIX) 40 MG tablet TAKE ONE TABLET BY MOUTH EVERY DAY  30 tablet  2  . polyethylene glycol powder (GLYCOLAX/MIRALAX) powder Take 17 g by mouth 2 (two) times daily. Until daily soft  stools  OTC  255 g  0  . Ferrous Sulfate (IRON) 325 (65 FE) MG TABS Take 325 mg by mouth daily.       Marland Kitchen POTASSIUM GLUCONATE PO Take 595 mg by mouth daily.        Current Facility-Administered Medications on File Prior to Visit  Medication Dose Route Frequency Provider Last Rate Last Dose  . influenza  inactive virus vaccine (FLUZONE/FLUARIX) injection 0.5 mL  0.5 mL Intramuscular Once Shelia Media, MD          Review of Systems  Constitutional: Negative for fever, chills and appetite change.  HENT: Positive for sore throat and postnasal drip.   Respiratory: Positive for cough. Negative for chest tightness, shortness of breath and wheezing.   Cardiovascular: Negative for chest pain and palpitations.  Gastrointestinal: Negative for nausea and vomiting.  Psychiatric/Behavioral: Negative for behavioral problems and agitation. The patient is not nervous/anxious.     BP 152/66  Pulse 67  Temp(Src) 98.2 F (36.8 C) (Oral)  Resp 16  Wt 85 lb (38.556 kg)  BMI 17.77 kg/m2  SpO2 98%     Objective:   Physical Exam  Constitutional: She  is oriented to person, place, and time.  Elderly, frail, NAD  HENT:  Head: Normocephalic and atraumatic.  Mouth/Throat: No oropharyngeal exudate.  Bilateral ear cerumen impaction. Pharyngeal erythema. No exudate.  Cardiovascular: Normal rate, regular rhythm and normal heart sounds.   Pulmonary/Chest: Effort normal and breath sounds normal. She has no wheezes. She has no rales.  Lymphadenopathy:    She has no cervical adenopathy.  Neurological: She is alert and oriented to person, place, and time.  Skin: Skin is warm and dry.  Psychiatric: She has a normal mood and affect. Her behavior is normal. Judgment and thought content normal.        Assessment & Plan:

## 2012-06-21 NOTE — Assessment & Plan Note (Signed)
Bilateral ears with cerumen impaction blocking full visualization of TM. Bilateral ear lavage ordered.

## 2012-07-13 ENCOUNTER — Other Ambulatory Visit: Payer: Self-pay | Admitting: Cardiovascular Disease

## 2012-07-13 NOTE — Telephone Encounter (Signed)
Refilled Digoxin sent to [facility] pharmacy.

## 2012-07-19 ENCOUNTER — Ambulatory Visit (INDEPENDENT_AMBULATORY_CARE_PROVIDER_SITE_OTHER): Payer: Medicare Other | Admitting: Internal Medicine

## 2012-07-19 ENCOUNTER — Encounter: Payer: Self-pay | Admitting: Internal Medicine

## 2012-07-19 VITALS — BP 140/80 | HR 69 | Temp 97.6°F | Wt 84.0 lb

## 2012-07-19 DIAGNOSIS — R634 Abnormal weight loss: Secondary | ICD-10-CM

## 2012-07-19 DIAGNOSIS — F411 Generalized anxiety disorder: Secondary | ICD-10-CM | POA: Insufficient documentation

## 2012-07-19 DIAGNOSIS — H9193 Unspecified hearing loss, bilateral: Secondary | ICD-10-CM

## 2012-07-19 DIAGNOSIS — R1314 Dysphagia, pharyngoesophageal phase: Secondary | ICD-10-CM | POA: Insufficient documentation

## 2012-07-19 DIAGNOSIS — R319 Hematuria, unspecified: Secondary | ICD-10-CM

## 2012-07-19 DIAGNOSIS — R440 Auditory hallucinations: Secondary | ICD-10-CM

## 2012-07-19 DIAGNOSIS — R443 Hallucinations, unspecified: Secondary | ICD-10-CM

## 2012-07-19 DIAGNOSIS — G4700 Insomnia, unspecified: Secondary | ICD-10-CM | POA: Insufficient documentation

## 2012-07-19 DIAGNOSIS — H9190 Unspecified hearing loss, unspecified ear: Secondary | ICD-10-CM

## 2012-07-19 LAB — CBC WITH DIFFERENTIAL/PLATELET
Basophils Relative: 1.1 % (ref 0.0–3.0)
Eosinophils Relative: 1.8 % (ref 0.0–5.0)
Lymphocytes Relative: 27.9 % (ref 12.0–46.0)
MCV: 91.8 fl (ref 78.0–100.0)
Neutrophils Relative %: 60.9 % (ref 43.0–77.0)
RBC: 4.39 Mil/uL (ref 3.87–5.11)
WBC: 3.7 10*3/uL — ABNORMAL LOW (ref 4.5–10.5)

## 2012-07-19 LAB — COMPREHENSIVE METABOLIC PANEL
Albumin: 4.4 g/dL (ref 3.5–5.2)
Alkaline Phosphatase: 72 U/L (ref 39–117)
BUN: 14 mg/dL (ref 6–23)
Creatinine, Ser: 0.8 mg/dL (ref 0.4–1.2)
Glucose, Bld: 94 mg/dL (ref 70–99)
Potassium: 4.2 mEq/L (ref 3.5–5.1)

## 2012-07-19 LAB — POCT URINALYSIS DIPSTICK
Glucose, UA: NEGATIVE
Protein, UA: NEGATIVE
Spec Grav, UA: 1.015
Urobilinogen, UA: 0.2
pH, UA: 7

## 2012-07-19 MED ORDER — MIRTAZAPINE 15 MG PO TBDP: 15.0000 mg | ORAL_TABLET | Freq: Every day | ORAL | Status: DC

## 2012-07-19 NOTE — Progress Notes (Signed)
Subjective:    Patient ID: Jenny Burns, female    DOB: 02-23-1917, 79 y.o.   MRN: [id number]  HPI 79 year old female with history of hypertension, chronic back pain secondary to osteoarthritis, chronic constipation presents for followup. She has several concerns today. First, she notes over the last couple of weeks she has had intermittent episodes of hearing "Gospel music "throughout the day and night. She denies that this is ringing in her ears. She notes chronic hearing loss. She often has difficulty hearing her son when he is talking to her. Her son denies that she has had any recent confusion or change in mental status. She denies any recent fever, chills, dysuria, hematuria, flank pain, shortness of breath, chest pain, or other symptoms. She does have chronic constipation for which she takes MiraLax and Colace. She reports that her appetite has been good. Her weight has been relatively stable. She has been supplementing her diet with ensure a couple of times per day.  She reports increased anxiety lately as several of her friends have recently passed away. She feels that her time is near. She sometimes has difficulty falling asleep because of this. Next  She also notes recent difficulty swallowing. She has difficulty swallowing both solid foods and her pills. Pills and food seemed to get stuck in the middle of her chest. This is sometimes painful. This has not recently been evaluated. She denies any vomiting or nausea.  Outpatient Encounter Prescriptions as of 07/19/2012  Medication Sig Dispense Refill  . amLODipine (NORVASC) 5 MG tablet Take 1 tablet (5 mg total) by mouth daily.  90 tablet  3  . aspirin EC 81 MG tablet Take 81 mg by mouth daily.      Marland Kitchen atorvastatin (LIPITOR) 10 MG tablet Take 10 mg by mouth at bedtime.      . cholecalciferol (VITAMIN D) 1000 UNITS tablet Take 1,000 Units by mouth daily.      . cyanocobalamin 2000 MCG tablet Take 2,000 mcg by mouth daily.      . digoxin  (LANOXIN) 0.125 MG tablet Take 0.125 mg by mouth daily.      Marland Kitchen docusate sodium (DULCOLAX) 100 MG capsule Take 100 mg by mouth 2 (two) times daily as needed. For stool softner      . HYDROcodone-acetaminophen (NORCO/VICODIN) 5-325 MG per tablet Take 1 tablet by mouth every 6 (six) hours as needed for pain.  90 tablet  3  . isosorbide mononitrate (IMDUR) 30 MG 24 hr tablet Take 30 mg by mouth at bedtime.      Marland Kitchen lactulose (CHRONULAC) 10 GM/15ML solution Take 30 mLs (20 g total) by mouth 3 (three) times daily. Stop after bowel movement.  240 mL  0  . levothyroxine (SYNTHROID, LEVOTHROID) 50 MCG tablet Take 50 mcg by mouth daily.      Marland Kitchen lisinopril (PRINIVIL,ZESTRIL) 20 MG tablet Take 20 mg by mouth at bedtime.      . Multiple Vitamin (MULTIVITAMINS PO) Take 1 tablet by mouth daily.        . pantoprazole (PROTONIX) 40 MG tablet TAKE ONE TABLET BY MOUTH EVERY DAY  30 tablet  2  . polyethylene glycol powder (GLYCOLAX/MIRALAX) powder Take 17 g by mouth 2 (two) times daily. Until daily soft stools  OTC  255 g  0  . POTASSIUM GLUCONATE PO Take 595 mg by mouth daily.       . digoxin (LANOXIN) 0.125 MG tablet TAKE ONE TABLET BY MOUTH EVERY DAY  30 tablet  3  . Ferrous Sulfate (IRON) 325 (65 FE) MG TABS Take 325 mg by mouth daily.         BP 140/80  Pulse 69  Temp(Src) 97.6 F (36.4 C) (Oral)  Wt 84 lb (38.102 kg)  BMI 17.56 kg/m2  SpO2 98%  Review of Systems  Constitutional: Positive for fatigue. Negative for fever, chills, appetite change and unexpected weight change.  HENT: Positive for trouble swallowing. Negative for ear pain, congestion, sore throat, neck pain, voice change and sinus pressure.   Eyes: Negative for visual disturbance.  Respiratory: Negative for cough, shortness of breath, wheezing and stridor.   Cardiovascular: Negative for chest pain, palpitations and leg swelling.  Gastrointestinal: Positive for constipation. Negative for nausea, vomiting, abdominal pain, diarrhea, blood in  stool, abdominal distention and anal bleeding.  Genitourinary: Negative for dysuria and flank pain.  Musculoskeletal: Positive for myalgias, back pain (chronic) and arthralgias. Negative for gait problem.  Skin: Negative for color change and rash.  Neurological: Negative for dizziness and headaches.  Hematological: Negative for adenopathy. Does not bruise/bleed easily.  Psychiatric/Behavioral: Positive for sleep disturbance. Negative for suicidal ideas and dysphoric mood. The patient is nervous/anxious.        Objective:   Physical Exam  Constitutional: She is oriented to person, place, and time. She appears well-developed and well-nourished. No distress.  HENT:  Head: Normocephalic and atraumatic.  Right Ear: External ear normal.  Left Ear: External ear normal.  Nose: Nose normal.  Mouth/Throat: Oropharynx is clear and moist. No oropharyngeal exudate.  Eyes: Conjunctivae are normal. Pupils are equal, round, and reactive to light. Right eye exhibits no discharge. Left eye exhibits no discharge. No scleral icterus.  Neck: Normal range of motion. Neck supple. No tracheal deviation present. No thyromegaly present.  Cardiovascular: Normal rate, regular rhythm, normal heart sounds and intact distal pulses.  Exam reveals no gallop and no friction rub.   No murmur heard. Pulmonary/Chest: Effort normal and breath sounds normal. No accessory muscle usage. Not tachypneic. No respiratory distress. She has no decreased breath sounds. She has no wheezes. She has no rhonchi. She has no rales. She exhibits no tenderness.  Abdominal: Soft. Bowel sounds are normal. She exhibits no distension and no mass. There is no tenderness. There is no rebound and no guarding.  Musculoskeletal: Normal range of motion. She exhibits no edema and no tenderness.  Lymphadenopathy:    She has no cervical adenopathy.  Neurological: She is alert and oriented to person, place, and time. No cranial nerve deficit. She exhibits  normal muscle tone. Coordination normal.  Skin: Skin is warm and dry. No rash noted. She is not diaphoretic. No erythema. No pallor.  Psychiatric: She has a normal mood and affect. Her behavior is normal. Judgment and thought content normal.          Assessment & Plan:

## 2012-07-19 NOTE — Patient Instructions (Signed)
Start Remeron 15mg  at bedtime to help with sleep and appetite.  We will set you up for hearing testing.  We will set up a swallow study to assess for narrowing in the throat.  Follow up 2-3 weeks.

## 2012-07-19 NOTE — Assessment & Plan Note (Signed)
Having difficulty swallowing solid foods and pills. Symptoms most consistent with esophageal stricture. Will set up barium swallow for evaluation.

## 2012-07-19 NOTE — Assessment & Plan Note (Signed)
Wt Readings from Last 3 Encounters:  07/19/12 84 lb (38.102 kg)  06/21/12 85 lb (38.556 kg)  04/20/12 85 lb (38.556 kg)   Weight has been relatively stable over last 6 months. Appetite good, however having some difficulty swallowing. Will set up barium swallow for evaluation. Continue Ensure as diet supplement 2 cans per day minimum. Will start remeron to help with anxiety, sleep and as boost to appetite.

## 2012-07-19 NOTE — Assessment & Plan Note (Signed)
Will set up ENT evaluation for hearing testing.

## 2012-07-19 NOTE — Assessment & Plan Note (Signed)
Symptomatic hearing "gospel music" in both ears intermittently. Unclear etiology of this. Exam normal. Mental status normal. Electrolytes normal. TSH normal. No evidence of current infection. Not taking psychotropic meds. Question if this may be related to tinnitis. Will set up evaluation with ENT for hearing assessment.

## 2012-07-19 NOTE — Assessment & Plan Note (Signed)
Symptoms of anxiety increased recently with passing of several of her close friends. Having trouble sleeping because of anxiety. Will start Remeron 15mg  daily at bedtime to help with symptoms. Follow up 1 month and prn.  Over of which >50% spent in face-to-face contact with patient and her son discussing plan of care for ongoing medical issues.

## 2012-07-19 NOTE — Assessment & Plan Note (Signed)
Recent difficulty falling asleep. Suspect this may be related to increased anxiety. Will add Remeron at bedtime to see if any improvement. Follow up 1 month.

## 2012-07-20 NOTE — Addendum Note (Signed)
Addended by: Montine Circle D on: 07/20/2012 10:10 AM   Modules accepted: Orders

## 2012-07-22 ENCOUNTER — Telehealth: Payer: Self-pay | Admitting: *Deleted

## 2012-07-22 LAB — URINE CULTURE: Organism ID, Bacteria: NO GROWTH

## 2012-07-22 NOTE — Telephone Encounter (Signed)
Called patient about her lab results, she is not having any urinary problems but she is having a lot of pain in her shoulders and back. Just wanted to let you know, I talked directly to the patient. She does have an appointment in May with Dr. Dan Humphreys

## 2012-07-25 ENCOUNTER — Ambulatory Visit: Payer: Medicare Other | Admitting: Internal Medicine

## 2012-07-25 ENCOUNTER — Ambulatory Visit: Payer: Self-pay | Admitting: Internal Medicine

## 2012-07-25 ENCOUNTER — Telehealth: Payer: Self-pay | Admitting: Internal Medicine

## 2012-07-25 DIAGNOSIS — Q394 Esophageal web: Secondary | ICD-10-CM

## 2012-07-25 NOTE — Telephone Encounter (Signed)
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 We will need to set up GI evaluation for further treatment. This is likely leading to difficulty swallowing.  Please ask if the pt has a preference as to who would compete this evaluation. In [place], this would most likely be Dr. Markham Jordan or Ricki Rodriguez.

## 2012-07-26 NOTE — Telephone Encounter (Signed)
Spoke with patient son, [name] and informed him of the test results. He verbally agreed understanding and would like to go ahead with referral to GI, no preference.

## 2012-07-27 NOTE — Telephone Encounter (Signed)
We could wait if she would like, but I suspect that this web in her esophagus is contributing to her trouble swallowing, so it should be addressed at some point.

## 2012-07-27 NOTE — Telephone Encounter (Signed)
Pts son, Jenny Burns called back. Pt has some bruising on her face from barium swallow, would rather not proceed with referral unless Dr. Dan Humphreys really recommends it. Please advise. He would like a call back.

## 2012-07-27 NOTE — Telephone Encounter (Signed)
Pts son, [name], called, states never received results of barium swallow. Advised of results. He will check with pt. On referral and call back.

## 2012-08-03 NOTE — Telephone Encounter (Signed)
Spoke with son, [name], he state he has already spoken with someone to schedule her appointment. She has an appointment to see the PA at Dr. Cyndie Mull office.

## 2012-08-11 ENCOUNTER — Encounter: Payer: Self-pay | Admitting: Internal Medicine

## 2012-08-18 ENCOUNTER — Ambulatory Visit: Payer: Medicare Other | Admitting: Internal Medicine

## 2012-08-18 ENCOUNTER — Ambulatory Visit (INDEPENDENT_AMBULATORY_CARE_PROVIDER_SITE_OTHER): Payer: Medicare Other | Admitting: Internal Medicine

## 2012-08-18 ENCOUNTER — Encounter: Payer: Self-pay | Admitting: Internal Medicine

## 2012-08-18 VITALS — BP 120/70 | HR 59 | Temp 98.3°F | Wt 82.0 lb

## 2012-08-18 DIAGNOSIS — F411 Generalized anxiety disorder: Secondary | ICD-10-CM

## 2012-08-18 DIAGNOSIS — D649 Anemia, unspecified: Secondary | ICD-10-CM

## 2012-08-18 DIAGNOSIS — K225 Diverticulum of esophagus, acquired: Secondary | ICD-10-CM

## 2012-08-18 DIAGNOSIS — H9193 Unspecified hearing loss, bilateral: Secondary | ICD-10-CM

## 2012-08-18 DIAGNOSIS — G4700 Insomnia, unspecified: Secondary | ICD-10-CM

## 2012-08-18 DIAGNOSIS — H9190 Unspecified hearing loss, unspecified ear: Secondary | ICD-10-CM

## 2012-08-18 LAB — CBC WITH DIFFERENTIAL/PLATELET
Eosinophils Relative: 2.5 % (ref 0.0–5.0)
HCT: 38 % (ref 36.0–46.0)
Hemoglobin: 13.1 g/dL (ref 12.0–15.0)
Lymphs Abs: 1.1 10*3/uL (ref 0.7–4.0)
Monocytes Relative: 8.4 % (ref 3.0–12.0)
Neutro Abs: 2.1 10*3/uL (ref 1.4–7.7)
WBC: 3.5 10*3/uL — ABNORMAL LOW (ref 4.5–10.5)

## 2012-08-18 MED ORDER — LEVOTHYROXINE SODIUM 50 MCG PO TABS: 50.0000 ug | ORAL_TABLET | Freq: Every day | ORAL | Status: AC

## 2012-08-18 MED ORDER — PANTOPRAZOLE SODIUM 40 MG PO TBEC: DELAYED_RELEASE_TABLET | ORAL | Status: AC

## 2012-08-18 MED ORDER — MIRTAZAPINE 30 MG PO TBDP: 30.0000 mg | ORAL_TABLET | Freq: Every day | ORAL | Status: DC

## 2012-08-18 NOTE — Assessment & Plan Note (Signed)
Symptoms initially improved on Remeron, then recurred. Will try increasing dose of Remeron to 30mg  daily. Follow up 4 weeks.

## 2012-08-18 NOTE — Assessment & Plan Note (Signed)
Poorly controlled with Remeron 15mg  daily. Will increase to 30mg  daily. Follow up 4 weeks.

## 2012-08-18 NOTE — Assessment & Plan Note (Signed)
Will recheck CBC with labs today. 

## 2012-08-18 NOTE — Assessment & Plan Note (Signed)
Hearing loss bilaterally. ENT recommended hearing aid. Pt declined.

## 2012-08-18 NOTE — Progress Notes (Signed)
Subjective:    Patient ID: Jenny Burns, female    DOB: [birth year], 79 y.o.   MRN: [id number]  HPI 79 year old female with history of hypertension, hyperlipidemia, recent dysphasia resulting in malnutrition and weight loss, insomnia, and anxiety presents for followup. At her last visit, she complained of difficulty swallowing both solids and liquids. She sometimes feels as if there is a bulging in her throat. She underwent barium swallow which showed Zenker's diverticulum. She is scheduled to followup with GI next week. She reports continued difficulty swallowing and keeping down food. She has lost another 4 pounds.  She also notes ongoing symptoms of difficulty falling and staying asleep and anxiety. At her last visit, we started Remeron 15 mg at bedtime. She reports she initially had some improvement for a few days and then her symptoms recurred.  At her last visit, we also set up referral to ENT to discuss ringing in her ears and hearing loss. She was found to have diminished hearing bilaterally. She opted not to pursue hearing aids.  Outpatient Encounter Prescriptions as of 08/18/2012  Medication Sig Dispense Refill  . amLODipine (NORVASC) 5 MG tablet Take 1 tablet (5 mg total) by mouth daily.  90 tablet  3  . aspirin EC 81 MG tablet Take 81 mg by mouth daily.      Marland Kitchen atorvastatin (LIPITOR) 10 MG tablet Take 10 mg by mouth at bedtime.      . cholecalciferol (VITAMIN D) 1000 UNITS tablet Take 1,000 Units by mouth daily.      . cyanocobalamin 2000 MCG tablet Take 2,000 mcg by mouth daily.      . digoxin (LANOXIN) 0.125 MG tablet TAKE ONE TABLET BY MOUTH EVERY DAY  30 tablet  3  . docusate sodium (DULCOLAX) 100 MG capsule Take 100 mg by mouth 2 (two) times daily as needed. For stool softner      . Ferrous Sulfate (IRON) 325 (65 FE) MG TABS Take 325 mg by mouth daily.       Marland Kitchen HYDROcodone-acetaminophen (NORCO/VICODIN) 5-325 MG per tablet Take 1 tablet by mouth every 6 (six) hours as needed for  pain.  90 tablet  3  . isosorbide mononitrate (IMDUR) 30 MG 24 hr tablet Take 30 mg by mouth at bedtime.      Marland Kitchen levothyroxine (SYNTHROID, LEVOTHROID) 50 MCG tablet Take 1 tablet (50 mcg total) by mouth daily.  30 tablet  11  . lisinopril (PRINIVIL,ZESTRIL) 20 MG tablet Take 20 mg by mouth at bedtime.      . mirtazapine (REMERON SOL-TAB) 30 MG disintegrating tablet Take 1 tablet (30 mg total) by mouth at bedtime.  30 tablet  1  . Multiple Vitamin (MULTIVITAMINS PO) Take 1 tablet by mouth daily.        . pantoprazole (PROTONIX) 40 MG tablet TAKE ONE TABLET BY MOUTH EVERY DAY  30 tablet  11  . polyethylene glycol powder (GLYCOLAX/MIRALAX) powder Take 17 g by mouth 2 (two) times daily. Until daily soft stools  OTC  255 g  0  . POTASSIUM GLUCONATE PO Take 595 mg by mouth daily.       . digoxin (LANOXIN) 0.125 MG tablet Take 0.125 mg by mouth daily.      Marland Kitchen lactulose (CHRONULAC) 10 GM/15ML solution Take 30 mLs (20 g total) by mouth 3 (three) times daily. Stop after bowel movement.  240 mL  0   Facility-Administered Encounter Medications as of 08/18/2012  Medication Dose Route Frequency Provider Last Rate Last  Dose  . influenza  inactive virus vaccine (FLUZONE/FLUARIX) injection 0.5 mL  0.5 mL Intramuscular Once Wynona Dove, MD       BP 120/70  Pulse 59  Temp(Src) 98.3 F (36.8 C) (Oral)  SpO2 97%  Review of Systems  Constitutional: Negative for fever, chills, appetite change, fatigue and unexpected weight change.  HENT: Positive for trouble swallowing. Negative for ear pain, congestion, sore throat, neck pain, voice change and sinus pressure.   Eyes: Negative for visual disturbance.  Respiratory: Negative for cough, shortness of breath, wheezing and stridor.   Cardiovascular: Negative for chest pain, palpitations and leg swelling.  Gastrointestinal: Negative for nausea, vomiting, abdominal pain, diarrhea, constipation, blood in stool, abdominal distention and anal bleeding.   Genitourinary: Negative for dysuria and flank pain.  Musculoskeletal: Negative for myalgias, arthralgias and gait problem.  Skin: Negative for color change and rash.  Neurological: Negative for dizziness and headaches.  Hematological: Negative for adenopathy. Does not bruise/bleed easily.  Psychiatric/Behavioral: Positive for sleep disturbance. Negative for suicidal ideas and dysphoric mood. The patient is nervous/anxious.        Objective:   Physical Exam  Constitutional: She is oriented to person, place, and time. She appears well-developed and well-nourished. No distress.  Elderly female sitting in wheelchair NAD  HENT:  Head: Normocephalic and atraumatic.  Right Ear: External ear normal.  Left Ear: External ear normal.  Nose: Nose normal.  Mouth/Throat: Oropharynx is clear and moist. No oropharyngeal exudate.  Eyes: Conjunctivae are normal. Pupils are equal, round, and reactive to light. Right eye exhibits no discharge. Left eye exhibits no discharge. No scleral icterus.  Neck: Normal range of motion. Neck supple. No tracheal deviation present. No thyromegaly present.  Cardiovascular: Normal rate, regular rhythm, normal heart sounds and intact distal pulses.  Exam reveals no gallop and no friction rub.   No murmur heard. Pulmonary/Chest: Effort normal and breath sounds normal. No accessory muscle usage. Not tachypneic. No respiratory distress. She has no decreased breath sounds. She has no wheezes. She has no rhonchi. She has no rales. She exhibits no tenderness.  Musculoskeletal: Normal range of motion. She exhibits no edema and no tenderness.  Lymphadenopathy:    She has no cervical adenopathy.  Neurological: She is alert and oriented to person, place, and time. No cranial nerve deficit. She exhibits normal muscle tone. Coordination normal.  Skin: Skin is warm and dry. No rash noted. She is not diaphoretic. No erythema. No pallor.  Psychiatric: She has a normal mood and affect.  Her behavior is normal. Judgment and thought content normal.          Assessment & Plan:

## 2012-08-18 NOTE — Assessment & Plan Note (Signed)
Discussed recent UGI findings of Zenker's Diverticulum. GI evaluation pending for next week. Discussed that her nutrition status will likely not improve unless she is able to tolerate foods po. May ultimately need repair of diverticulum.

## 2012-09-19 ENCOUNTER — Ambulatory Visit (INDEPENDENT_AMBULATORY_CARE_PROVIDER_SITE_OTHER): Payer: Medicare Other | Admitting: Internal Medicine

## 2012-09-19 ENCOUNTER — Encounter: Payer: Self-pay | Admitting: Internal Medicine

## 2012-09-19 VITALS — BP 112/60 | HR 56 | Temp 98.3°F

## 2012-09-19 DIAGNOSIS — R634 Abnormal weight loss: Secondary | ICD-10-CM

## 2012-09-19 DIAGNOSIS — G4700 Insomnia, unspecified: Secondary | ICD-10-CM

## 2012-09-19 DIAGNOSIS — N390 Urinary tract infection, site not specified: Secondary | ICD-10-CM

## 2012-09-19 DIAGNOSIS — R1314 Dysphagia, pharyngoesophageal phase: Secondary | ICD-10-CM

## 2012-09-19 LAB — POCT URINALYSIS DIPSTICK
Bilirubin, UA: NEGATIVE
pH, UA: 6

## 2012-09-19 MED ORDER — ZALEPLON 5 MG PO CAPS: 5.0000 mg | ORAL_CAPSULE | Freq: Every day | ORAL | Status: DC

## 2012-09-19 NOTE — Progress Notes (Signed)
Subjective:    Patient ID: Jenny Burns, female    DOB: 01/05/1917, 79 y.o.   MRN: [id number]  HPI 79 year old female with history of hypertension, hypothyroidism, GERD, recent dysphasia with diagnosis of Zencker's diverticulum, weight loss presents for followup. She reports continued issues with swallowing food and her medication. She was seen by GI physician today and is scheduled for upper endoscopy on July 9. Her weight has been relatively stable. She has been trying to supplement her caloric intake with higher calorie foods.  She is concerned about continued difficulty sleeping. She has not been taking Remeron because she has not found this helpful. She has never taken medication such as Ambien or Sonata. She reports that at present she is only sleeping about one hour per night and she is constantly exhausted. Her son is concerned about this as she seems progressively weak.  Outpatient Encounter Prescriptions as of 09/19/2012  Medication Sig Dispense Refill  . amLODipine (NORVASC) 5 MG tablet Take 1 tablet (5 mg total) by mouth daily.  90 tablet  3  . aspirin EC 81 MG tablet Take 81 mg by mouth daily.      . cholecalciferol (VITAMIN D) 1000 UNITS tablet Take 1,000 Units by mouth daily.      . cyanocobalamin 2000 MCG tablet Take 2,000 mcg by mouth daily.      Marland Kitchen docusate sodium (DULCOLAX) 100 MG capsule Take 100 mg by mouth 2 (two) times daily as needed. For stool softner      . Ferrous Sulfate (IRON) 325 (65 FE) MG TABS Take 325 mg by mouth daily.       Marland Kitchen HYDROcodone-acetaminophen (NORCO/VICODIN) 5-325 MG per tablet Take 1 tablet by mouth every 6 (six) hours as needed for pain.  90 tablet  3  . isosorbide mononitrate (IMDUR) 30 MG 24 hr tablet Take 30 mg by mouth at bedtime.      Marland Kitchen levothyroxine (SYNTHROID, LEVOTHROID) 50 MCG tablet Take 1 tablet (50 mcg total) by mouth daily.  30 tablet  11  . lisinopril (PRINIVIL,ZESTRIL) 20 MG tablet Take 20 mg by mouth at bedtime.      . Multiple Vitamin  (MULTIVITAMINS PO) Take 1 tablet by mouth daily.        . pantoprazole (PROTONIX) 40 MG tablet TAKE ONE TABLET BY MOUTH EVERY DAY  30 tablet  11  . polyethylene glycol powder (GLYCOLAX/MIRALAX) powder Take 17 g by mouth 2 (two) times daily. Until daily soft stools  OTC  255 g  0  . POTASSIUM GLUCONATE PO Take 595 mg by mouth daily.       . digoxin (LANOXIN) 0.125 MG tablet Take 0.125 mg by mouth daily.      . zaleplon (SONATA) 5 MG capsule Take 1 capsule (5 mg total) by mouth at bedtime.  30 capsule  1  . [DISCONTINUED] influenza  inactive virus vaccine (FLUZONE/FLUARIX) injection 0.5 mL        No facility-administered encounter medications on file as of 09/19/2012.   BP 112/60  Pulse 56  Temp(Src) 98.3 F (36.8 C) (Oral)  SpO2 98%  Review of Systems  Constitutional: Positive for appetite change and fatigue. Negative for fever, chills and unexpected weight change.  HENT: Negative for ear pain, congestion, sore throat, trouble swallowing, neck pain, voice change and sinus pressure.   Eyes: Negative for visual disturbance.  Respiratory: Negative for cough, shortness of breath, wheezing and stridor.   Cardiovascular: Negative for chest pain, palpitations and leg swelling.  Gastrointestinal: Positive for constipation. Negative for nausea, vomiting, abdominal pain, diarrhea, blood in stool, abdominal distention and anal bleeding.  Genitourinary: Negative for dysuria and flank pain.  Musculoskeletal: Positive for myalgias, back pain and arthralgias. Negative for gait problem.  Skin: Negative for color change and rash.  Neurological: Positive for weakness. Negative for dizziness and headaches.  Hematological: Negative for adenopathy. Does not bruise/bleed easily.  Psychiatric/Behavioral: Negative for suicidal ideas, sleep disturbance and dysphoric mood. The patient is not nervous/anxious.        Objective:   Physical Exam  Constitutional: She is oriented to person, place, and time. She  appears well-developed and well-nourished. No distress.  Elderly female, sitting in wheelchair NAD  HENT:  Head: Normocephalic and atraumatic.  Right Ear: External ear normal.  Left Ear: External ear normal.  Nose: Nose normal.  Mouth/Throat: Oropharynx is clear and moist. No oropharyngeal exudate.  Eyes: Conjunctivae are normal. Pupils are equal, round, and reactive to light. Right eye exhibits no discharge. Left eye exhibits no discharge. No scleral icterus.  Neck: Normal range of motion. Neck supple. No tracheal deviation present. No thyromegaly present.  Cardiovascular: Normal rate, regular rhythm, normal heart sounds and intact distal pulses.  Exam reveals no gallop and no friction rub.   No murmur heard. Pulmonary/Chest: Effort normal and breath sounds normal. No accessory muscle usage. Not tachypneic. No respiratory distress. She has no decreased breath sounds. She has no wheezes. She has no rhonchi. She has no rales. She exhibits no tenderness.  Abdominal: Soft. Bowel sounds are normal. She exhibits no distension. There is no tenderness.  Musculoskeletal: Normal range of motion. She exhibits no edema and no tenderness.  Lymphadenopathy:    She has no cervical adenopathy.  Neurological: She is alert and oriented to person, place, and time. No cranial nerve deficit. She exhibits normal muscle tone. Coordination normal.  Skin: Skin is warm and dry. No rash noted. She is not diaphoretic. No erythema. No pallor.  Psychiatric: She has a normal mood and affect. Her behavior is normal. Judgment and thought content normal.          Assessment & Plan:

## 2012-09-19 NOTE — Assessment & Plan Note (Signed)
Chronic dysphasia secondary to Zenker's diverticulum. Seen by GI physician today. Scheduled for upper endoscopy. Will follow.

## 2012-09-19 NOTE — Assessment & Plan Note (Signed)
Persistent insomnia. No improvement with Remeron. Discussed several options with her and her son today including use of Sonata, Ambien, or Seroquel. Will try Sonata 5 mg at bedtime. Patient's son will call or e-mail with update. Followup in 4 weeks.

## 2012-09-19 NOTE — Assessment & Plan Note (Signed)
Wt Readings from Last 3 Encounters:  08/18/12 82 lb (37.195 kg)  07/19/12 84 lb (38.102 kg)  06/21/12 85 lb (38.556 kg)   Weight is down another 2 pounds. Secondary to poor oral intake secondary to dysphagia. Upper endoscopy is scheduled for July 9. Will follow.

## 2012-09-19 NOTE — Patient Instructions (Signed)
Stop atorvastatin (Lipitor) and Mirtazapine (Remeron).

## 2012-09-21 ENCOUNTER — Other Ambulatory Visit: Payer: Self-pay | Admitting: Cardiovascular Disease

## 2012-09-21 NOTE — Telephone Encounter (Signed)
Refilled Isosorbide sent to [facility] pharmacy.

## 2012-10-03 ENCOUNTER — Encounter: Payer: Self-pay | Admitting: Cardiovascular Disease

## 2012-10-03 ENCOUNTER — Ambulatory Visit (INDEPENDENT_AMBULATORY_CARE_PROVIDER_SITE_OTHER): Payer: Medicare Other | Admitting: Cardiovascular Disease

## 2012-10-03 VITALS — BP 150/77 | HR 76 | Ht <= 58 in | Wt 83.0 lb

## 2012-10-03 DIAGNOSIS — I10 Essential (primary) hypertension: Secondary | ICD-10-CM

## 2012-10-03 DIAGNOSIS — E785 Hyperlipidemia, unspecified: Secondary | ICD-10-CM

## 2012-10-03 DIAGNOSIS — I4891 Unspecified atrial fibrillation: Secondary | ICD-10-CM

## 2012-10-03 DIAGNOSIS — K225 Diverticulum of esophagus, acquired: Secondary | ICD-10-CM

## 2012-10-03 DIAGNOSIS — I739 Peripheral vascular disease, unspecified: Secondary | ICD-10-CM

## 2012-10-03 NOTE — Assessment & Plan Note (Signed)
She states she is going in for EGD and possible surgical repair in the beginning of July 2014.

## 2012-10-03 NOTE — Progress Notes (Signed)
Patient ID: Jenny Burns, female    DOB: 1916/11/19, 79 y.o.   MRN: [id number]  HPI Comments: 79 year old woman with a history of frequent falls, traumatic injury to her shoulder requiring surgery, also history of bladder and hysterectomy surgery in 2006 with long hospital course, chronic atrial fibrillation Not on anticoagulation as she is a high fall risk candidate, hypertension who presents for routine followup.  Notes indicate she has a Zenker's diverticulum. Son reports she is scheduled for EGD and surgical repair of this diverticulum at the beginning of July by Dr. Bluford Kaufmann.   Metoprolol has been held in the past secondary to bradycardia.  Her gait is poor, she has significant muscle atrophy of her legs. Still able to walk short distances in her house. Large family that helps take care of her. chronic back pain.   She reports having a tibia fracture September 2013 after weightbearing on her leg. Medical management was recommended with no significant intervention.  She reports cholesterol medication was held in the past  She had lower extremity ABIs that showed significant disease bilaterally. She has requested medical management She has a history of back surgery after breaking her back in 2007. She currently lives with her son who helps take care of her. She is unable to climb into the bathtub.  EKG shows atrial fibrillation with rate of 76 beats per minute, no other significant ST or T wave changes, APCs noted    Outpatient Encounter Prescriptions as of 10/03/2012  Medication Sig Dispense Refill  . amLODipine (NORVASC) 5 MG tablet Take 1 tablet (5 mg total) by mouth daily.  90 tablet  3  . aspirin EC 81 MG tablet Take 81 mg by mouth daily.      . cholecalciferol (VITAMIN D) 1000 UNITS tablet Take 1,000 Units by mouth daily.      . cyanocobalamin 2000 MCG tablet Take 2,000 mcg by mouth daily.      . digoxin (LANOXIN) 0.125 MG tablet Take 0.125 mg by mouth daily.      Marland Kitchen docusate sodium  (DULCOLAX) 100 MG capsule Take 100 mg by mouth 2 (two) times daily as needed. For stool softner      . Ferrous Sulfate (IRON) 325 (65 FE) MG TABS Take 325 mg by mouth daily.       Marland Kitchen HYDROcodone-acetaminophen (NORCO/VICODIN) 5-325 MG per tablet Take 1 tablet by mouth every 6 (six) hours as needed for pain.  90 tablet  3  . isosorbide mononitrate (IMDUR) 30 MG 24 hr tablet TAKE ONE TABLET BY MOUTH EVERY DAY  90 tablet  3  . levothyroxine (SYNTHROID, LEVOTHROID) 50 MCG tablet Take 1 tablet (50 mcg total) by mouth daily.  30 tablet  11  . lisinopril (PRINIVIL,ZESTRIL) 20 MG tablet Take 20 mg by mouth at bedtime.      . Multiple Vitamin (MULTIVITAMINS PO) Take 1 tablet by mouth daily.        . pantoprazole (PROTONIX) 40 MG tablet TAKE ONE TABLET BY MOUTH EVERY DAY  30 tablet  11  . polyethylene glycol powder (GLYCOLAX/MIRALAX) powder Take 17 g by mouth 2 (two) times daily. Until daily soft stools  OTC  255 g  0  . POTASSIUM GLUCONATE PO Take 595 mg by mouth daily.       . zaleplon (SONATA) 5 MG capsule Take 1 capsule (5 mg total) by mouth at bedtime.  30 capsule  1   Review of Systems  HENT: Negative.   Eyes: Negative.  Respiratory: Negative.   Cardiovascular: Negative.        Cold discolored feet bilaterally  Gastrointestinal: Negative.   Musculoskeletal: Positive for back pain, arthralgias and gait problem.  Skin: Negative.   Neurological: Positive for weakness.  Psychiatric/Behavioral: Negative.   All other systems reviewed and are negative.    BP 150/77  Pulse 76  Ht 4\' 10"  (1.473 m)  Wt 83 lb (37.649 kg)  BMI 17.35 kg/m2  Physical Exam  Nursing note and vitals reviewed. Constitutional: She is oriented to person, place, and time. She appears well-developed and well-nourished.  Sitting in a wheelchair, thin.  HENT:  Head: Normocephalic.  Nose: Nose normal.  Mouth/Throat: Oropharynx is clear and moist.  Eyes: Conjunctivae are normal. Pupils are equal, round, and reactive to  light.  Neck: Normal range of motion. Neck supple. No JVD present.  Cardiovascular: Normal rate, S1 normal, S2 normal and intact distal pulses.  An irregularly irregular rhythm present. Exam reveals decreased pulses. Exam reveals no gallop and no friction rub.   Murmur heard.  Crescendo systolic murmur is present with a grade of 1/6  Pulses:      Carotid pulses are 2+ on the right side, and 2+ on the left side.      Radial pulses are 2+ on the right side, and 2+ on the left side.       Dorsalis pedis pulses are 0 on the right side, and 0 on the left side.       Posterior tibial pulses are 0 on the right side, and 0 on the left side.  Pulmonary/Chest: Effort normal and breath sounds normal. No respiratory distress. She has no wheezes. She has no rales. She exhibits no tenderness.  Abdominal: Soft. Bowel sounds are normal. She exhibits no distension. There is no tenderness.  Musculoskeletal: Normal range of motion. She exhibits no edema and no tenderness.  Lymphadenopathy:    She has no cervical adenopathy.  Neurological: She is alert and oriented to person, place, and time. Coordination normal.  Skin: Skin is warm and dry. No rash noted. No erythema.  Psychiatric: She has a normal mood and affect. Her behavior is normal. Judgment and thought content normal.    Assessment and Plan

## 2012-10-03 NOTE — Assessment & Plan Note (Signed)
Heart rate well-controlled. Continue current medications. Not a candidate for anticoagulation given high fall risk.

## 2012-10-03 NOTE — Assessment & Plan Note (Signed)
Blood pressure is well controlled on today's visit. No changes made to the medications. 

## 2012-10-03 NOTE — Assessment & Plan Note (Signed)
Previous lower STEMI ultrasound suggested significant CAD. No further workup per the patient

## 2012-10-03 NOTE — Patient Instructions (Addendum)
You are doing well. No medication changes were made.  Please call us if you have new issues that need to be addressed before your next appt.  Your physician wants you to follow-up in: 6 months.  You will receive a reminder letter in the mail two months in advance. If you don't receive a letter, please call our office to schedule the follow-up appointment.   

## 2012-10-03 NOTE — Assessment & Plan Note (Addendum)
Currently off her statin. Suspect this could be secondary to muscle weakness. This is probably a good idea given her poor gait and muscle atrophy.

## 2012-10-12 ENCOUNTER — Other Ambulatory Visit: Payer: Self-pay | Admitting: Cardiovascular Disease

## 2012-10-13 ENCOUNTER — Other Ambulatory Visit: Payer: Self-pay

## 2012-10-13 ENCOUNTER — Other Ambulatory Visit: Payer: Self-pay | Admitting: *Deleted

## 2012-10-13 DIAGNOSIS — M1990 Unspecified osteoarthritis, unspecified site: Secondary | ICD-10-CM

## 2012-10-13 MED ORDER — HYDROCODONE-ACETAMINOPHEN 5-325 MG PO TABS: 1.0000 | ORAL_TABLET | Freq: Four times a day (QID) | ORAL | Status: AC | PRN

## 2012-10-13 NOTE — Telephone Encounter (Signed)
Patient is currently off her atorvastatin.

## 2012-10-19 ENCOUNTER — Ambulatory Visit: Payer: Self-pay | Admitting: Gastroenterology

## 2012-10-20 ENCOUNTER — Other Ambulatory Visit: Payer: Self-pay | Admitting: Cardiovascular Disease

## 2012-10-20 ENCOUNTER — Other Ambulatory Visit: Payer: Self-pay | Admitting: *Deleted

## 2012-10-20 MED ORDER — LISINOPRIL 20 MG PO TABS: 20.0000 mg | ORAL_TABLET | Freq: Every day | ORAL | Status: AC

## 2012-10-20 NOTE — Telephone Encounter (Signed)
Refilled Amlodipine sent to [facility] pharmacy.

## 2012-10-20 NOTE — Telephone Encounter (Signed)
Eprescribed.

## 2012-11-09 ENCOUNTER — Ambulatory Visit (INDEPENDENT_AMBULATORY_CARE_PROVIDER_SITE_OTHER): Payer: Medicare Other | Admitting: Internal Medicine

## 2012-11-09 ENCOUNTER — Encounter: Payer: Self-pay | Admitting: Internal Medicine

## 2012-11-09 ENCOUNTER — Other Ambulatory Visit (HOSPITAL_COMMUNITY)
Admission: RE | Admit: 2012-11-09 | Discharge: 2012-11-09 | Disposition: A | Discharge status: Home / Self Care | Payer: Medicare Other | Source: Ambulatory Visit | Attending: Internal Medicine | Admitting: Internal Medicine

## 2012-11-09 VITALS — BP 130/70 | HR 70 | Temp 97.9°F | Wt 80.0 lb

## 2012-11-09 DIAGNOSIS — R319 Hematuria, unspecified: Secondary | ICD-10-CM | POA: Insufficient documentation

## 2012-11-09 DIAGNOSIS — Q619 Cystic kidney disease, unspecified: Secondary | ICD-10-CM

## 2012-11-09 DIAGNOSIS — R109 Unspecified abdominal pain: Secondary | ICD-10-CM

## 2012-11-09 DIAGNOSIS — R1314 Dysphagia, pharyngoesophageal phase: Secondary | ICD-10-CM

## 2012-11-09 DIAGNOSIS — R5383 Other fatigue: Secondary | ICD-10-CM

## 2012-11-09 DIAGNOSIS — R5381 Other malaise: Secondary | ICD-10-CM

## 2012-11-09 DIAGNOSIS — G4700 Insomnia, unspecified: Secondary | ICD-10-CM

## 2012-11-09 DIAGNOSIS — I10 Essential (primary) hypertension: Secondary | ICD-10-CM

## 2012-11-09 DIAGNOSIS — N390 Urinary tract infection, site not specified: Secondary | ICD-10-CM | POA: Insufficient documentation

## 2012-11-09 DIAGNOSIS — N281 Cyst of kidney, acquired: Secondary | ICD-10-CM | POA: Insufficient documentation

## 2012-11-09 DIAGNOSIS — R634 Abnormal weight loss: Secondary | ICD-10-CM

## 2012-11-09 DIAGNOSIS — G8929 Other chronic pain: Secondary | ICD-10-CM

## 2012-11-09 DIAGNOSIS — R300 Dysuria: Secondary | ICD-10-CM

## 2012-11-09 LAB — POCT URINALYSIS DIPSTICK
Bilirubin, UA: NEGATIVE
Ketones, UA: NEGATIVE
Nitrite, UA: NEGATIVE
Protein, UA: NEGATIVE

## 2012-11-09 LAB — CBC WITH DIFFERENTIAL/PLATELET
Basophils Absolute: 0 10*3/uL (ref 0.0–0.1)
Eosinophils Absolute: 0.1 10*3/uL (ref 0.0–0.7)
HCT: 41.7 % (ref 36.0–46.0)
Lymphs Abs: 0.9 10*3/uL (ref 0.7–4.0)
MCHC: 33.6 g/dL (ref 30.0–36.0)
MCV: 93.9 fl (ref 78.0–100.0)
Monocytes Absolute: 0.4 10*3/uL (ref 0.1–1.0)
Neutro Abs: 2.8 10*3/uL (ref 1.4–7.7)
Platelets: 203 10*3/uL (ref 150.0–400.0)
RDW: 14.8 % — ABNORMAL HIGH (ref 11.5–14.6)

## 2012-11-09 LAB — COMPREHENSIVE METABOLIC PANEL
ALT: 9 U/L (ref 0–35)
AST: 14 U/L (ref 0–37)
CO2: 29 mEq/L (ref 19–32)
GFR: 80.16 mL/min (ref >60.00)
Sodium: 136 mEq/L (ref 135–145)
Total Bilirubin: 0.4 mg/dL (ref 0.3–1.2)
Total Protein: 6.5 g/dL (ref 6.0–8.3)

## 2012-11-09 LAB — TSH: TSH: 1.72 u[IU]/mL (ref 0.35–5.50)

## 2012-11-09 MED ORDER — CIPROFLOXACIN HCL 250 MG PO TABS: 250.0000 mg | ORAL_TABLET | Freq: Two times a day (BID) | ORAL | Status: AC

## 2012-11-09 MED ORDER — ZALEPLON 10 MG PO CAPS: 10.0000 mg | ORAL_CAPSULE | Freq: Every day | ORAL | Status: AC

## 2012-11-09 NOTE — Progress Notes (Signed)
Subjective:    Patient ID: Jenny Burns, female    DOB: 1917/01/12, 79 y.o.   MRN: [id number]  HPI 79 year old female with history of hypertension, weight loss, recurrent urinary tract infections, esophageal narrowing with a Zenker's diverticulum presents for followup. She reports that she is generally feeling poorly. Her appetite is poor. She continues to have some difficulty swallowing after her recent EGD and stretching of her esophagus. She is trying to supplement her diet with Ensure, typically once per day. She denies any abdominal pain, constipation, diarrhea. She notes a several month history of gradually worsening left-sided flank pain. She questions whether she may have recurrent urinary tract infection. She denies any fever or chills. Pain is described as aching it does not generally radiate. It is made worse with movement.  Her son provides care for her and reports that her blood pressure has been well-controlled on current medications. Typically 130s over 80s. She has not had any recent falls.  Outpatient Encounter Prescriptions as of 11/09/2012  Medication Sig Dispense Refill  . amLODipine (NORVASC) 5 MG tablet TAKE ONE TABLET BY MOUTH EVERY DAY  90 tablet  3  . aspirin EC 81 MG tablet Take 81 mg by mouth daily.      Marland Kitchen atorvastatin (LIPITOR) 10 MG tablet TAKE ONE TABLET BY MOUTH EVERY DAY  90 tablet  0  . cholecalciferol (VITAMIN D) 1000 UNITS tablet Take 1,000 Units by mouth daily.      . cyanocobalamin 2000 MCG tablet Take 2,000 mcg by mouth daily.      . digoxin (LANOXIN) 0.125 MG tablet Take 0.125 mg by mouth daily.      Marland Kitchen docusate sodium (DULCOLAX) 100 MG capsule Take 100 mg by mouth 2 (two) times daily as needed. For stool softner      . Ferrous Sulfate (IRON) 325 (65 FE) MG TABS Take 325 mg by mouth daily.       Marland Kitchen HYDROcodone-acetaminophen (NORCO/VICODIN) 5-325 MG per tablet Take 1 tablet by mouth every 6 (six) hours as needed for pain.  90 tablet  3  . isosorbide mononitrate  (IMDUR) 30 MG 24 hr tablet TAKE ONE TABLET BY MOUTH EVERY DAY  90 tablet  3  . levothyroxine (SYNTHROID, LEVOTHROID) 50 MCG tablet Take 1 tablet (50 mcg total) by mouth daily.  30 tablet  11  . lisinopril (PRINIVIL,ZESTRIL) 20 MG tablet Take 1 tablet (20 mg total) by mouth at bedtime.  30 tablet  5  . Multiple Vitamin (MULTIVITAMINS PO) Take 1 tablet by mouth daily.        . pantoprazole (PROTONIX) 40 MG tablet TAKE ONE TABLET BY MOUTH EVERY DAY  30 tablet  11  . polyethylene glycol powder (GLYCOLAX/MIRALAX) powder Take 17 g by mouth 2 (two) times daily. Until daily soft stools  OTC  255 g  0  . POTASSIUM GLUCONATE PO Take 595 mg by mouth daily.       . zaleplon (SONATA) 10 MG capsule Take 1 capsule (10 mg total) by mouth at bedtime.  30 capsule  1   No facility-administered encounter medications on file as of 11/09/2012.   BP 130/70  Pulse 70  Temp(Src) 97.9 F (36.6 C) (Oral)  Wt 80 lb (36.288 kg)  BMI 16.72 kg/m2  SpO2 99%  Review of Systems  Constitutional: Positive for fatigue. Negative for fever, chills, appetite change and unexpected weight change.  HENT: Negative for ear pain, congestion, sore throat, trouble swallowing, neck pain, voice change and sinus  pressure.   Eyes: Negative for visual disturbance.  Respiratory: Negative for cough, shortness of breath, wheezing and stridor.   Cardiovascular: Negative for chest pain, palpitations and leg swelling.  Gastrointestinal: Positive for abdominal pain. Negative for nausea, vomiting, diarrhea, constipation, blood in stool, abdominal distention and anal bleeding.  Genitourinary: Positive for dysuria and flank pain. Negative for urgency, frequency, hematuria and decreased urine volume.  Musculoskeletal: Negative for myalgias, arthralgias and gait problem.  Skin: Negative for color change and rash.  Neurological: Negative for dizziness and headaches.  Hematological: Negative for adenopathy. Does not bruise/bleed easily.   Psychiatric/Behavioral: Negative for suicidal ideas, sleep disturbance and dysphoric mood. The patient is not nervous/anxious.        Objective:   Physical Exam  Constitutional: She is oriented to person, place, and time. She appears well-developed and well-nourished. No distress.  HENT:  Head: Normocephalic and atraumatic.  Right Ear: External ear normal.  Left Ear: External ear normal.  Nose: Nose normal.  Mouth/Throat: Oropharynx is clear and moist. No oropharyngeal exudate.  Eyes: Conjunctivae are normal. Pupils are equal, round, and reactive to light. Right eye exhibits no discharge. Left eye exhibits no discharge. No scleral icterus.  Neck: Normal range of motion. Neck supple. No tracheal deviation present. No thyromegaly present.  Cardiovascular: Normal rate, regular rhythm, normal heart sounds and intact distal pulses.  Exam reveals no gallop and no friction rub.   No murmur heard. Pulmonary/Chest: Effort normal and breath sounds normal. No accessory muscle usage. Not tachypneic. No respiratory distress. She has no decreased breath sounds. She has no wheezes. She has no rhonchi. She has no rales. She exhibits no tenderness.  Abdominal: Soft. She exhibits no distension and no mass. There is tenderness (left anterior mid abdomen and left flank). There is no rebound and no guarding.  Musculoskeletal: Normal range of motion. She exhibits no edema and no tenderness.       Cervical back: She exhibits deformity (kyphosis).  Lymphadenopathy:    She has no cervical adenopathy.  Neurological: She is alert and oriented to person, place, and time. No cranial nerve deficit. She exhibits normal muscle tone. Coordination normal.  Skin: Skin is warm and dry. No rash noted. She is not diaphoretic. No erythema. No pallor.  Psychiatric: She has a normal mood and affect. Her behavior is normal. Judgment and thought content normal.          Assessment & Plan:

## 2012-11-09 NOTE — Assessment & Plan Note (Signed)
Persistent symptoms of dysphasia after recent EGD and esophageal stretching. Encouraged her to followup with GI physician. Encouraged her to continue to supplement diet with use of high protein drinks such as Ensure.

## 2012-11-09 NOTE — Assessment & Plan Note (Signed)
Patient continues to have difficulty falling asleep and staying asleep. Will try increasing Sonata to 10 mg at bedtime.

## 2012-11-09 NOTE — Assessment & Plan Note (Signed)
Symptoms and urinalysis consistent with urinary tract infection. Will treat with Cipro. Will send urine for culture. Patient will call if symptoms are not improving.

## 2012-11-09 NOTE — Assessment & Plan Note (Signed)
Wt Readings from Last 3 Encounters:  11/09/12 80 lb (36.288 kg)  10/03/12 83 lb (37.649 kg)  08/18/12 82 lb (37.195 kg)   Gradual weight loss over the last 6 months. Appetite is limited. Patient prefers not to add take stimulants at this time. Caloric intake is also limited by chronic dysphagia. Encouraged follow up with GI after recent esophageal stretching. Encouraged use of high-protein drinks such as Ensure with goal of 3 cans per day. Will check CBC, CMP, TSH with labs.

## 2012-11-09 NOTE — Assessment & Plan Note (Signed)
CT of the in October 2013 showed a cyst in the left kidney. Patient now having some flank pain. Will get a followup renal ultrasound.

## 2012-11-09 NOTE — Assessment & Plan Note (Signed)
BP Readings from Last 3 Encounters:  11/09/12 130/70  10/03/12 150/77  09/19/12 112/60   BP well controlled on current medications. Will continue.

## 2012-11-10 ENCOUNTER — Encounter: Payer: Self-pay | Admitting: Internal Medicine

## 2012-11-11 ENCOUNTER — Ambulatory Visit: Payer: Self-pay | Admitting: Internal Medicine

## 2012-11-14 ENCOUNTER — Telehealth: Payer: Self-pay | Admitting: Internal Medicine

## 2012-11-14 NOTE — Telephone Encounter (Signed)
US of the kidneys showed a small cyst in the left kidney but no findings to explain left sided abdominal pain. Have symptoms improved?

## 2012-11-14 NOTE — Telephone Encounter (Signed)
Informed patient son and he verbalized understanding. Stated she is better since starting the abx.

## 2012-11-16 ENCOUNTER — Other Ambulatory Visit: Payer: Self-pay | Admitting: Cardiovascular Disease

## 2012-11-17 ENCOUNTER — Other Ambulatory Visit: Payer: Self-pay | Admitting: *Deleted

## 2012-11-17 MED ORDER — DIGOXIN 125 MCG PO TABS: 0.1250 mg | ORAL_TABLET | Freq: Every day | ORAL | Status: AC

## 2012-11-17 NOTE — Telephone Encounter (Signed)
Refilled Digoxin sent to [facility].

## 2012-11-21 ENCOUNTER — Other Ambulatory Visit (INDEPENDENT_AMBULATORY_CARE_PROVIDER_SITE_OTHER): Payer: Medicare Other

## 2012-11-21 ENCOUNTER — Encounter: Payer: Self-pay | Admitting: Internal Medicine

## 2012-11-21 DIAGNOSIS — N390 Urinary tract infection, site not specified: Secondary | ICD-10-CM

## 2012-11-21 LAB — POCT URINALYSIS DIPSTICK
Bilirubin, UA: NEGATIVE
Glucose, UA: NEGATIVE
Leukocytes, UA: NEGATIVE
Nitrite, UA: NEGATIVE
Urobilinogen, UA: 0.2

## 2012-12-02 LAB — COMPREHENSIVE METABOLIC PANEL
Anion Gap: 4 — ABNORMAL LOW (ref 7–16)
BUN: 12 mg/dL (ref 7–18)
Bilirubin,Total: 0.6 mg/dL (ref 0.2–1.0)
Chloride: 96 mmol/L — ABNORMAL LOW (ref 98–107)
Co2: 31 mmol/L (ref 21–32)
Creatinine: 0.62 mg/dL (ref 0.60–1.30)
EGFR (Non-African Amer.): >60
Glucose: 119 mg/dL — ABNORMAL HIGH (ref 65–99)
SGOT(AST): 30 U/L (ref 15–37)
SGPT (ALT): 18 U/L (ref 12–78)
Sodium: 131 mmol/L — ABNORMAL LOW (ref 136–145)

## 2012-12-02 LAB — URINALYSIS, COMPLETE
Bacteria: NONE SEEN
Bilirubin,UR: NEGATIVE
Glucose,UR: NEGATIVE mg/dL (ref 0–75)
Ketone: NEGATIVE
Leukocyte Esterase: NEGATIVE
Nitrite: NEGATIVE
Ph: 8 (ref 4.5–8.0)
Protein: NEGATIVE
RBC,UR: 6 /HPF (ref 0–5)
Specific Gravity: 1.005 (ref 1.003–1.030)
Squamous Epithelial: NONE SEEN
WBC UR: NONE SEEN /HPF (ref 0–5)

## 2012-12-02 LAB — CBC
HCT: 42.2 % (ref 35.0–47.0)
MCH: 32 pg (ref 26.0–34.0)
MCHC: 35.1 g/dL (ref 32.0–36.0)
MCV: 91 fL (ref 80–100)
Platelet: 203 10*3/uL (ref 150–440)
RDW: 14.2 % (ref 11.5–14.5)
WBC: 8.2 10*3/uL (ref 3.6–11.0)

## 2012-12-02 LAB — CK TOTAL AND CKMB (NOT AT ARMC): CK-MB: 1.6 ng/mL (ref 0.5–3.6)

## 2012-12-02 LAB — PRO B NATRIURETIC PEPTIDE: B-Type Natriuretic Peptide: 4775 pg/mL — ABNORMAL HIGH (ref 0–450)

## 2012-12-02 LAB — TROPONIN I: Troponin-I: <0.02 ng/mL

## 2012-12-03 LAB — CBC WITH DIFFERENTIAL/PLATELET
Basophil #: 0 10*3/uL (ref 0.0–0.1)
Basophil %: 0.6 %
HCT: 40.6 % (ref 35.0–47.0)
HGB: 14 g/dL (ref 12.0–16.0)
MCH: 31.6 pg (ref 26.0–34.0)
MCHC: 34.6 g/dL (ref 32.0–36.0)
MCV: 91 fL (ref 80–100)
Monocyte #: 0.7 x10 3/mm (ref 0.2–0.9)
Monocyte %: 11.3 %
Neutrophil #: 4.5 10*3/uL (ref 1.4–6.5)
Platelet: 190 10*3/uL (ref 150–440)
RDW: 13.8 % (ref 11.5–14.5)
WBC: 6.3 10*3/uL (ref 3.6–11.0)

## 2012-12-03 LAB — BASIC METABOLIC PANEL
Anion Gap: 9 (ref 7–16)
BUN: 9 mg/dL (ref 7–18)
Calcium, Total: 8.9 mg/dL (ref 8.5–10.1)
Co2: 28 mmol/L (ref 21–32)
Creatinine: 0.6 mg/dL (ref 0.60–1.30)
EGFR (African American): >60
EGFR (Non-African Amer.): >60
Glucose: 80 mg/dL (ref 65–99)
Osmolality: 271 (ref 275–301)
Potassium: 2.8 mmol/L — ABNORMAL LOW (ref 3.5–5.1)
Sodium: 137 mmol/L (ref 136–145)

## 2012-12-04 ENCOUNTER — Inpatient Hospital Stay: Payer: Self-pay | Admitting: Internal Medicine

## 2012-12-04 LAB — BASIC METABOLIC PANEL
Calcium, Total: 8.8 mg/dL (ref 8.5–10.1)
Chloride: 105 mmol/L (ref 98–107)
Creatinine: 0.62 mg/dL (ref 0.60–1.30)
Glucose: 119 mg/dL — ABNORMAL HIGH (ref 65–99)
Osmolality: 276 (ref 275–301)
Sodium: 138 mmol/L (ref 136–145)

## 2012-12-05 LAB — CBC WITH DIFFERENTIAL/PLATELET
Eosinophil %: 0 %
HCT: 43.4 % (ref 35.0–47.0)
HGB: 15.2 g/dL (ref 12.0–16.0)
Lymphocyte #: 0.4 10*3/uL — ABNORMAL LOW (ref 1.0–3.6)
MCV: 92 fL (ref 80–100)
Monocyte %: 3.1 %
Neutrophil #: 4.3 10*3/uL (ref 1.4–6.5)
WBC: 4.9 10*3/uL (ref 3.6–11.0)

## 2012-12-05 LAB — BASIC METABOLIC PANEL
Anion Gap: 9 (ref 7–16)
Calcium, Total: 8.9 mg/dL (ref 8.5–10.1)
Chloride: 110 mmol/L — ABNORMAL HIGH (ref 98–107)
Co2: 22 mmol/L (ref 21–32)
Creatinine: 0.77 mg/dL (ref 0.60–1.30)
EGFR (Non-African Amer.): >60
Glucose: 88 mg/dL (ref 65–99)
Osmolality: 284 (ref 275–301)
Potassium: 3.4 mmol/L — ABNORMAL LOW (ref 3.5–5.1)

## 2012-12-06 ENCOUNTER — Telehealth: Payer: Self-pay | Admitting: Internal Medicine

## 2012-12-06 NOTE — Telephone Encounter (Signed)
I am happy to put in order for Hospice if this is what family wants. In regards to the message about her being unable to move her limbs, this would be a new finding. Did she have an injury?

## 2012-12-06 NOTE — Telephone Encounter (Signed)
Debra with [facility] is calling to see if you would be willing to put and order in for hospice if Ms. Bang's family goes with that direction of care for her. Please contact Debra at [phone]

## 2012-12-06 NOTE — Telephone Encounter (Signed)
Fwd to Dr. Dan Humphreys, Erie Noe stated earlier today that patient son called to give [place] an FYI. She is not doing too well, she has lost all use of her limbs and down to 76 lbs.

## 2012-12-06 NOTE — Telephone Encounter (Signed)
Left message to call back  

## 2012-12-07 ENCOUNTER — Telehealth: Payer: Self-pay | Admitting: Internal Medicine

## 2012-12-07 ENCOUNTER — Ambulatory Visit: Payer: Medicare Other | Admitting: Internal Medicine

## 2012-12-07 LAB — BASIC METABOLIC PANEL
Anion Gap: 6 — ABNORMAL LOW (ref 7–16)
BUN: 33 mg/dL — ABNORMAL HIGH (ref 7–18)
Calcium, Total: 8.4 mg/dL — ABNORMAL LOW (ref 8.5–10.1)
Chloride: 108 mmol/L — ABNORMAL HIGH (ref 98–107)
Co2: 24 mmol/L (ref 21–32)
Creatinine: 0.74 mg/dL (ref 0.60–1.30)
EGFR (African American): >60
EGFR (Non-African Amer.): >60
Glucose: 101 mg/dL — ABNORMAL HIGH (ref 65–99)
Osmolality: 283 (ref 275–301)
Potassium: 4 mmol/L (ref 3.5–5.1)

## 2012-12-07 NOTE — Telephone Encounter (Signed)
FYI to Dr. Dan Humphreys, please read below

## 2012-12-07 NOTE — Telephone Encounter (Signed)
Pt's son came by and he says that his mother is going to Rehab and trying to build her mobility back up and hospice is already in the works and they are taking over.

## 2012-12-07 NOTE — Telephone Encounter (Signed)
Hospital follow up 9.11.14

## 2012-12-07 NOTE — Telephone Encounter (Signed)
FYI to Dr. Walker 

## 2012-12-12 ENCOUNTER — Ambulatory Visit: Payer: Self-pay | Admitting: Nurse Practitioner

## 2012-12-21 ENCOUNTER — Encounter: Payer: Self-pay | Admitting: *Deleted

## 2012-12-22 ENCOUNTER — Ambulatory Visit: Payer: Medicare Other | Admitting: Internal Medicine

## 2013-01-11 DEATH — deceased

## 2014-06-04 IMAGING — CR DG ABDOMEN ACUTE W/ 1V CHEST
4 series · 4 of 4 positions shown · non-contrast
Comparison: Chest film of 06/28/2008.

CLINICAL DATA: Pain for 2 weeks.  Abdominal distention and pain.

ACUTE ABDOMEN SERIES (ABDOMEN 2 VIEW & CHEST 1 VIEW)

[w abdomen decub (1 of 2)]
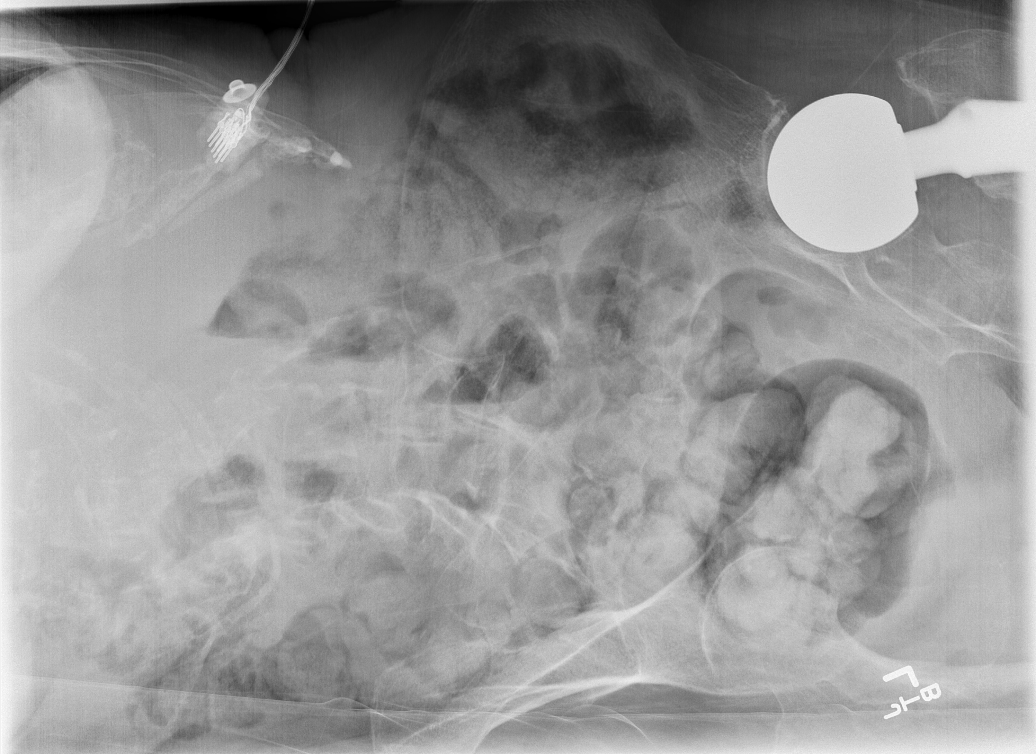

[w abdomen decub (2 of 2)]
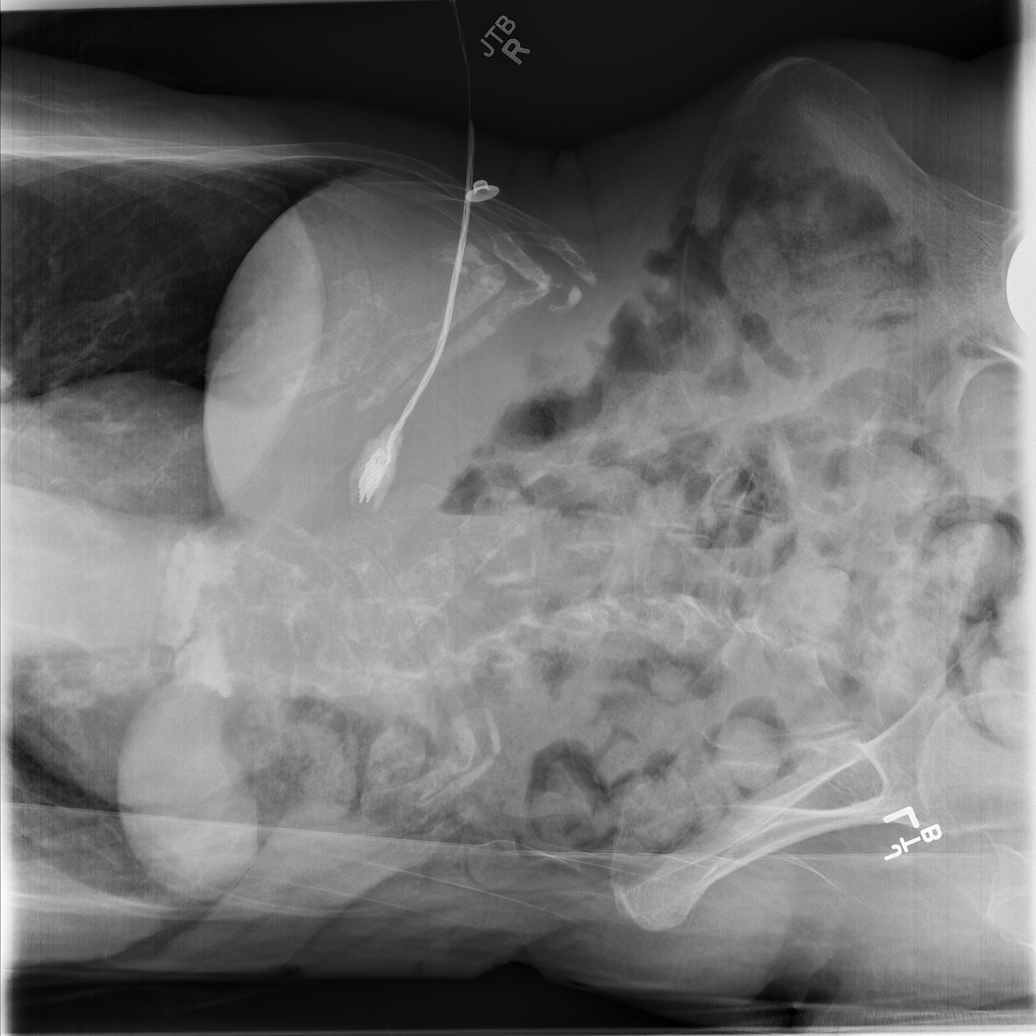

[t chest supine]
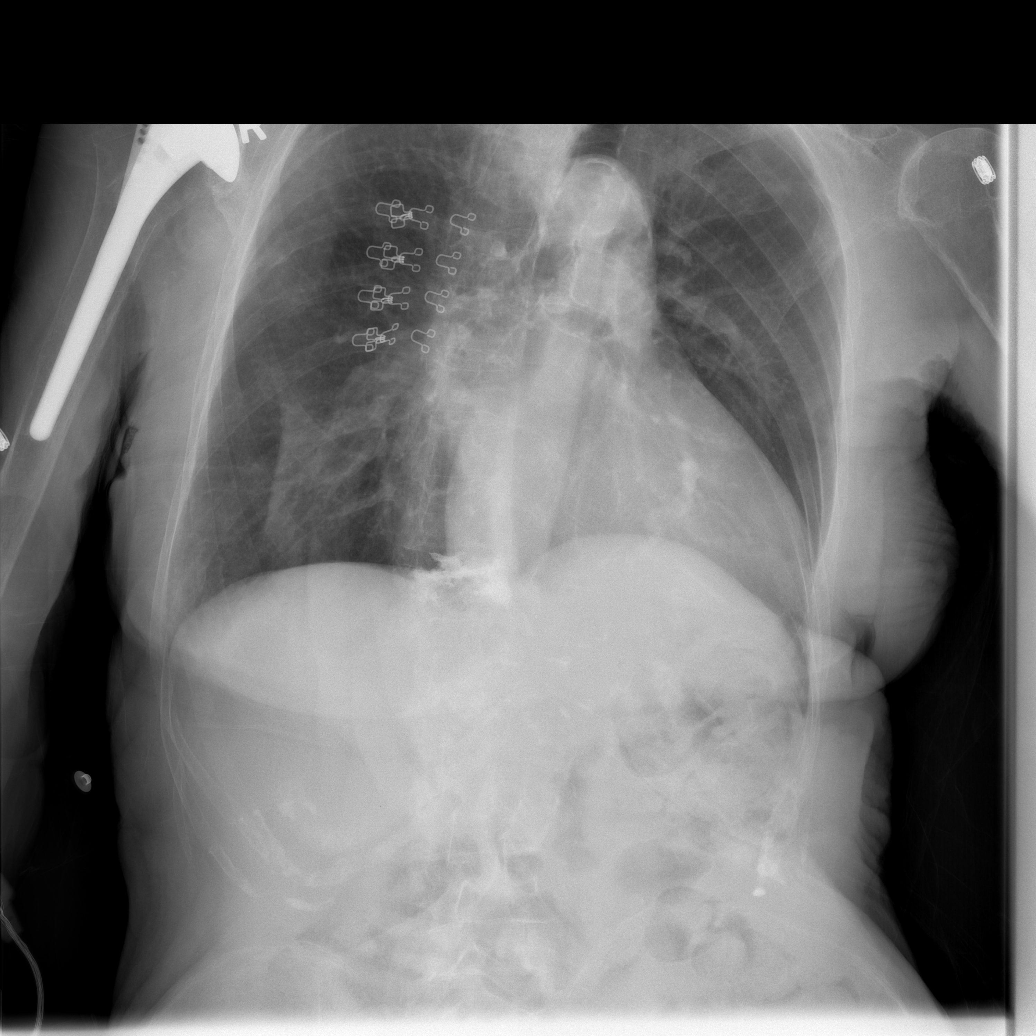

[t abdomen supine]
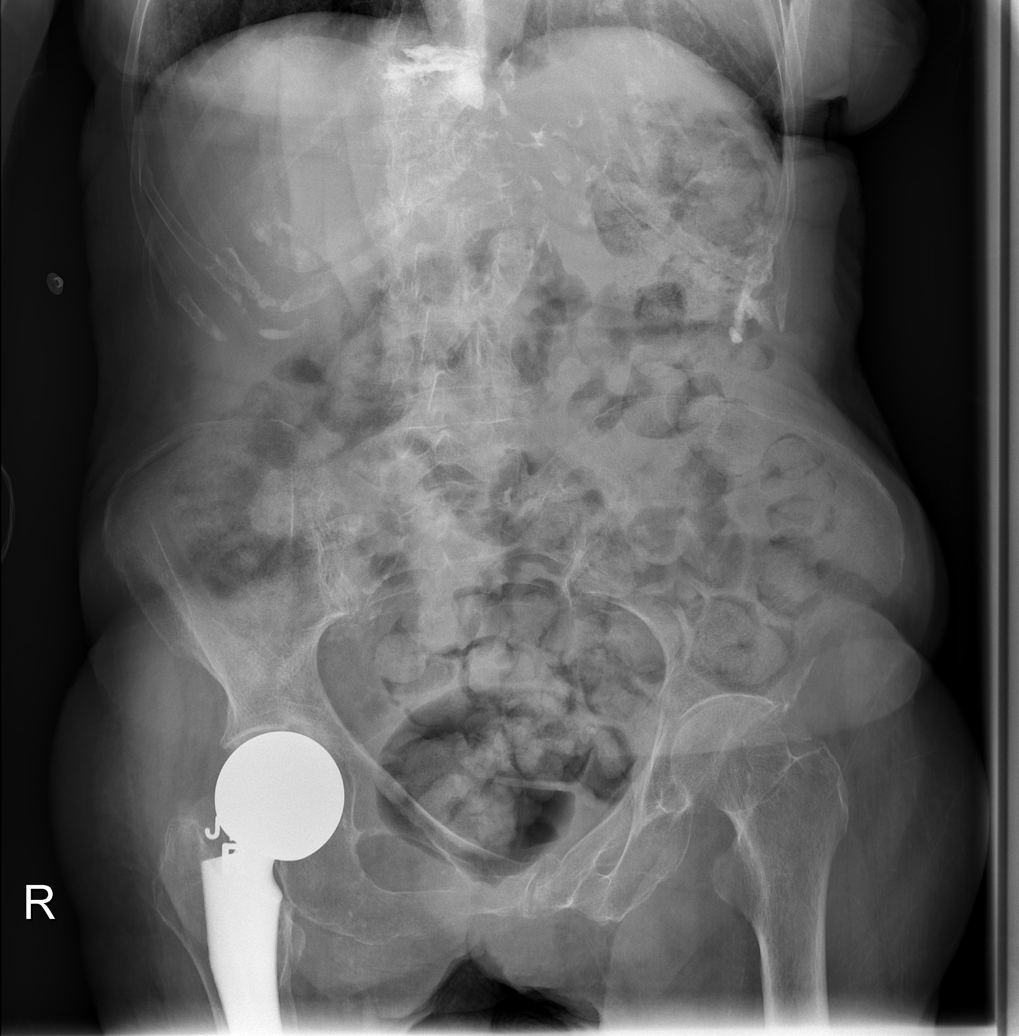

[4 of 4 positions shown; findings below may reference images not displayed]

FINDINGS: Frontal view of the chest demonstrates degraded by
overlying bra artifact.  Right shoulder arthroplasty.  Moderate
osteopenia.  Patient rotated left.

Mild cardiomegaly. No pleural effusion or pneumothorax.  No
congestive failure.

Artifactual increased density over the right hemithorax inferiorly.

Osteopenia with lower thoracic vertebral augmentation.

Abominal films demonstrate no free intraperitoneal air or
significant air fluid levels on decubitus imaging.

Moderate amount of left-sided stool. Distal gas and stool.  Right
hip arthroplasty.  No bowel obstruction.  No pneumatosis.  Vascular
calcifications. Suspect remote parasymphyseal left pubic bone
fractures.
IMPRESSION: Moderate colonic stool.  No obstruction or other acute process.

## 2014-08-03 NOTE — Consult Note (Signed)
   Comments   Follow up visit made. Pt more alert and interactive. Remains on 6L [place]. Wants food/drink and discussed risk vs benefits of eating. Will order a diet. Discussed with ST.  Phifer and I met with 4 sons and 4 daughters and multiple in-laws. In attendence were pt's HCPOA. Updated family. They all say that patient is tired and has verbalized a desire to "go home" to her deceased husband. After our meeting earlier, son asked patient what she wanted in regards to resuscitation and she wants to be a DNR. All family agree with this decision.  discussed possibilities for disposition including rehab vs hospice involvement. Family do not think that pt would be appropriate for rehab and ultimately were most interested in her going to the Hospice Home. Will follow up on her status tomorrow to determine if this would be appropriate vs home with hospice.  DNRhospice screening (home vs hospice home based on status tomorrow)  Clinical biochemist for Addendum Section:  Phifer, Izora Gala (MD) (Signed Addendum 25-Aug-14 16:26)  Billey Chang, NP, and I met with pt's family including daughter who is HCPOA. All are in agreement with DNR. Order entered. Family also feel that [facility] is most appropriate place for pt if she does not make considerable improvement. Will readdress in AM.   Agree with assessment and plan as outlined in above note.   Electronic Signatures: Zendaya Groseclose, Kirt Boys (NP)  (Signed 25-Aug-14 15:38)  Authored: Palliative Care   Last Updated: 25-Aug-14 16:26 by Phifer, Izora Gala (MD)

## 2014-08-03 NOTE — Discharge Summary (Signed)
PATIENT NAME:  Jenny Burns, Jenny Burns MR#:  [id number] DATE OF BIRTH:  April 11, 1917  DATE OF ADMISSION:  12/04/2012 DATE OF DISCHARGE:  12/07/2012  DISCHARGE DIAGNOSES: 1.  Altered mental status due to aspiration pneumonia.  2.  Aspiration pneumonia, dysphagia.  3.  Acute respiratory failure due to aspiration pneumonitis.  4.  Hypertension.  5.  Dementia.  6.  Chronic malnutrition.  7.  Hypothyroidism.  8.  Chronic anemia.   CONDITION ON DISCHARGE: Stable.   CODE STATUS: DO NOT RESUSCITATE.   DISCHARGE MEDICATIONS: 1.  Amlodipine 5 mg once a day.  2.  Centrum Silver once a day.  3.  Levothyroxine 50 mcg once a day.  4.  Vitamin B12, 1000 mcg once a day.  5.  Isosorbide mononitrate 30 mg once a day.  6.  Atorvastatin 10 mg once a day.  7.  Digoxin 125 mcg once a day.  8.  Ferrous sulfate 325 mg once a day.  9.  Dulcolax stool softener once a day.  10.  Lisinopril 20 mg once a day.  11.  Metoprolol 50 mg 2 times a day.  12.  Ranitidine 150 mg 2 times a day.  13.  Ensure Plus 4 times a day.   HOME OXYGEN: Yes, 3 liters nasal cannula supplementation.   DIET: Low sodium, low fat, low cholesterol, Advised to have puree-thin liquids and aspiration precautions.   ACTIVITY: As tolerated.   TIMEFRAME TO FOLLOW UP: Within 2 to 4 weeks. Routine followups with primary care physician. Family wished for hospice placement if condition getting worsening. Advised to have followup with hospice nurse.   HISTORY OF PRESENTING ILLNESS: A 81 year old female lives with her son, was very oriented, alert a few days before admission, but then she started feeling very weak, was unable to get up from the bed and was also taking her clothes off,  was unusual for the patient. EMS found her confused and weak. Recently, the family started noticing coughing while eating also. Denied any chest pain, fever or any other complaint on admission.   HOSPITAL COURSE AND STAY: For her altered mental status and delirium, MRI  of the brain was done, which was not suggestive of any acute changes. The patient had slow but gradual  improvement in her mental status with intermittent confusion. Initially, she was started on antibiotics for her possible pneumonia, but later on antibiotics discontinued. Dehydration was also present, which was corrected with IV fluids, and the patient had improvement in her mental status, though there was great concern of off aspiration, and speech and swallow evaluation was done, and they suggested modified barium swallow. At the same time, we also consulted palliative care due to her overall poor status and chances of getting progressive worsening of overall condition. Palliative care suggested hospice placement, but because she had mental status improvement, the family leaned towards placing her in rehab and if future worsening, they would like to consider hospice services. As she had improvement in mental status, she was started on pureed and dysphagia diet, and she was tolerating that. Along with that, she was on Ensure for her nutritional supplements.   Other medical issues addressed in this hospital stay: The patient had acute respiratory failure due to aspiration pneumonitis and questionable bronchitis type of finding on chest x-ray. The patient was started on nebulizer treatment and oxygen supplementation. Initially, she was on 6 liters nasal cannula oxygen supplementation, but later on, after improvement, came to 3 liters and was very comfortable with  that. May further go down up to 1 liter or 2 liters, or may not need at all if continues to improve at nursing home.   Other medical issues:  1.  Hypokalemia, replaced orally.  2.  Hypertension. Continue lisinopril, metoprolol, Imdur and amlodipine.  3.  Hypothyroidism: Continue Synthroid.  4.  Chronic anemia. Was on oral supplementation, to be continued.   IMPORTANT LABORATORY RESULTS IN THE HOSPITAL: WBC count on presentation 8.2, hemoglobin  14.8. Creatinine was 0.62 and BUN was 12. Sodium was 131. Chloride was 96. Troponin was less than 0.02 and BNP was 4775. CT of the head without contrast was without any findings. Chest x-ray showed borderline cardiomegaly, otherwise stable. Potassium level went down to 2.8. Chest x-ray report later on showed mild acute bronchitis finding or interstitial findings. Potassium level came to 3.4 and then later on up to 4. Creatinine remained 0.7. Magnesium was 2.0.   TOTAL TIME SPENT ON THIS DISCHARGE: 40 minutes.   ____________________________ Hope Pigeon Elisabeth Pigeon, MD vgv:dmm D: 12/07/2012 12:27:12 ET T: 12/07/2012 12:46:25 ET JOB#: [id number]  cc: Hope Pigeon. Elisabeth Pigeon, MD, <Dictator> Altamese Dilling MD ELECTRONICALLY SIGNED 12/16/2012 14:33

## 2014-08-03 NOTE — H&P (Signed)
PATIENT NAME:  Jenny Burns, CERROS MR#:  [id number] DATE OF BIRTH:  1916-06-28  DATE OF ADMISSION:  12/02/2012  PRIMARY DOCTOR:  Ronna Polio.    81 yr old female brough in by son for weakmess.pt wanted to use bedside commode, she could not get up, felt very weak and she wet the bed. She also was taking her gown off which is unusual for the patient.  The patient was brought in by EMS for confusion and weakness.  The patient usually uses bedside commode during nighttime and goes to bathroom during daytime.  Also eats regular food but her p.o. intake is relatively poor with recent episodes of cough noted during eating. The patient says that she has been having cough for about a week. She denies any chest pain. No trouble breathing. No dizziness. No weakness in hands or legs to suggest stroke. The patient is not oriented to place but able to answer my questions appropriately. The patient's granddaughter is at bedside. Did not have any seizure, did not have fever. No episodes of agitation at home.   PAST MEDICAL HISTORY: Significant for history of chronic atrial fibrillation, history of hyperlipidemia, hypertension, hypothyroidism.   HOME MEDICATIONS pt 1.  Amlodipine 5 mg p.o. daily.  2.  Atorvastatin 10 mg p.o. daily.  3.  Digoxin 0.125 mcg daily.  4.  Dulcolax stool softener 100 mg p.o. daily.  5.  Ferrous sulfate 325 mg p.o. daily.  6.  Imdur 30 mg p.o. daily.  7.  Levothyroxine 50 mcg p.o. daily.  8.  Lisinopril 20 mg p.o. daily.  9.  Potassium gluconate 595 mg p.o. daily.  10.  Vitamin B12 1000 mcg p.o. daily.  11.  Vitamin D3 1000 international units daily.   SOCIAL HISTORY: The patient's son lives with her. No smoking. No drinking.   PAST SURGICAL HISTORY: Significant for right shoulder surgery with right shoulder hemiarthroplasty. Recent EGD on July 9th for dysphagia. The patient has a medium-sized hiatal hernia and esophageal stricture which was dilated.   FAMILY HISTORY: No  hypertension or diabetes.   ALLERGIES: SHE IS ALLERGIC TO CODEINE, DARVOCET, MORPHINE, PERCOCET, SULFA AND TAPE.  REVIEW OF SYSTEMS CONSTITUTIONAL: No fever. Does have fatigue and weakness, started this morning.  EYES: No blurred vision.  ENT: No tinnitus. No epistaxis. The patient does have some difficulty swallowing.  RESPIRATIONS: Has some cough for a week with (prodcutive  phlegm. Denies any fever.  CARDIOVASCULAR: No chest pain. No orthopnea, no PND.  GASTROINTESTINAL: No nausea. No vomiting. Overall poor p.o. intake.  GENITOURINARY: No dysuria.  ENDOCRINE: No polyuria or nocturia.  HEMATOLOGIC: No anemia or easy bruising.  INTEGUMENTARY: No skin rashes.  MUSCULOSKELETAL: No joint pain.  NEUROLOGIC: Denies any numbness. No history of CVA.  PSYCHIATRIC: No anxiety.   PHYSICAL EXAMINATION VITAL SIGNS: Temperature 98.8, heart rate 86, blood pressure 165/55, sats 97% on room air.  GENERAL: The patient is alert, oriented to person but not oriented to place.  HEAD: Normocephalic, atraumatic.  EYES: Pupils equally reacting to light. No conjunctival pallor.  NOSE: No turbinate hypertrophy.  EARS: No tympanic membrane congestion.  MOUTH:  Looks dry. No oropharyngeal erythema.  NECK: Supple. No JVD. No carotid bruit.  RESPIRATORY: Clear to auscultation. No wheezing.  CARDIOVASCULAR: S1, S2 regular. No murmurs. Good pulses in carotid and femoral and dorsalis pedis.  GASTROINTESTINAL: Abdomen is nontender, nondistended. Bowel sounds present.  SKIN: Warm and dry.  NEUROLOGIC: The patient's cranial nerves II through XII intact. Power 5 out  of 5 upper and lower extremities. Sensations are intact. DTRs 2+ bilaterally.  PSYCHIATRIC: The patient alert and oriented to person only.   LAB DATA 1.  Chest x-ray shows borderline cardiomegaly, mitral annular calcification, right shoulder prosthesis.  2.  UA is clear.  3.  CT head shows atrophy and chronic microvascular ischemic changes. No acute  infarct.  4.  WBC 8.2, hemoglobin 14.8, hematocrit 42.2, platelets 203.  5.  ELECTROLYTES: Sodium 131, potassium 3.8, chloride 96, bicarb 31, BUN 12, creatinine 0.62, glucose 119.   6. Troponin less than 0.02.  7.  EKG shows atrial fibrillation at 77 beats per minute, no ST-T changes.   ASSESSMENT AND PLAN 1.  The patient is a 81 year old female with hypertension, chronic atrial fibrillation, hypothyroidism, came in with altered mental status with confusion, generalized weakness, likely probably due to overall decline in health with advanced age and early dementia. The patient's CT head has been negative. We will keep her overnight for possible transient ischemic attack. The patient has been in her good health until yesterday so we will evaluate for transient ischemic attack, get an MRI of the brain. The patient will have neuro checks. Continue aspirin and statins.  2.  Chronic atrial fibrillation. The patient's rate is controlled. Continue digoxin.  3.  Hypertension. The patient has accelerated blood pressure. Blood pressure during my visit  181/84. She is on lisinopril along with Imdur, continue those medications.  4.  Dysphagia with recent esophageal stricture, status post dilatation.  Has been having some cough. With her change in mental status with cough, evaluate for aspiration. She will get speech therapy evaluation tomorrow morning.  5.  Hypothyroidism. Continue levothyroxine.  6.  Generalized weakness. Get physical therapy evaluation.   TIME SPENT: About 55 minutes on history and physical. This is an observation history and physical.    ____________________________ Katha Hamming, MD sk:cs D: 12/02/2012 20:17:00 ET T: 12/02/2012 20:37:51 ET JOB#: [id number]  cc: Katha Hamming, MD, <Dictator> Katha Hamming MD ELECTRONICALLY SIGNED 01/09/2013 4:41

## 2014-11-23 IMAGING — RF DG BARIUM SWALLOW
7 series · 7 of 7 positions shown · non-contrast
Comparison: none

REASON FOR EXAM: dysphagia
COMMENTS:

PROCEDURE:     FL  - FL BARIUM SWALLOW  - July 25, 2012  [DATE]
RESULT:     Barium swallow was performed and reveals a prominent Billiot
diverticulum with prominent upper esophageal web. No reflux. No mass lesion.
This was a limited study due to the patient's age.

[Series 1: fluoro_barium 2fps_bw · 0.19mm/px · 1 of 1 slices shown (1 of 7)]
[im 1/1]
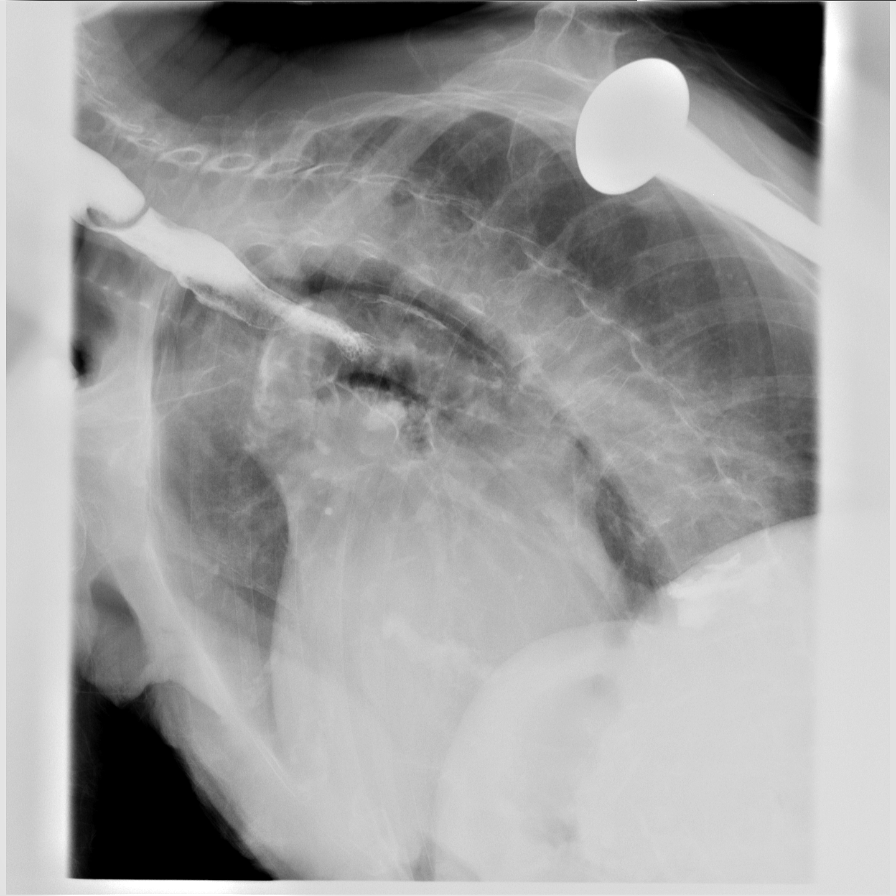

[Series 2: fluoro_barium 2fps_bw · 0.19mm/px · 1 of 1 slices shown (2 of 7)]
[im 1/1]
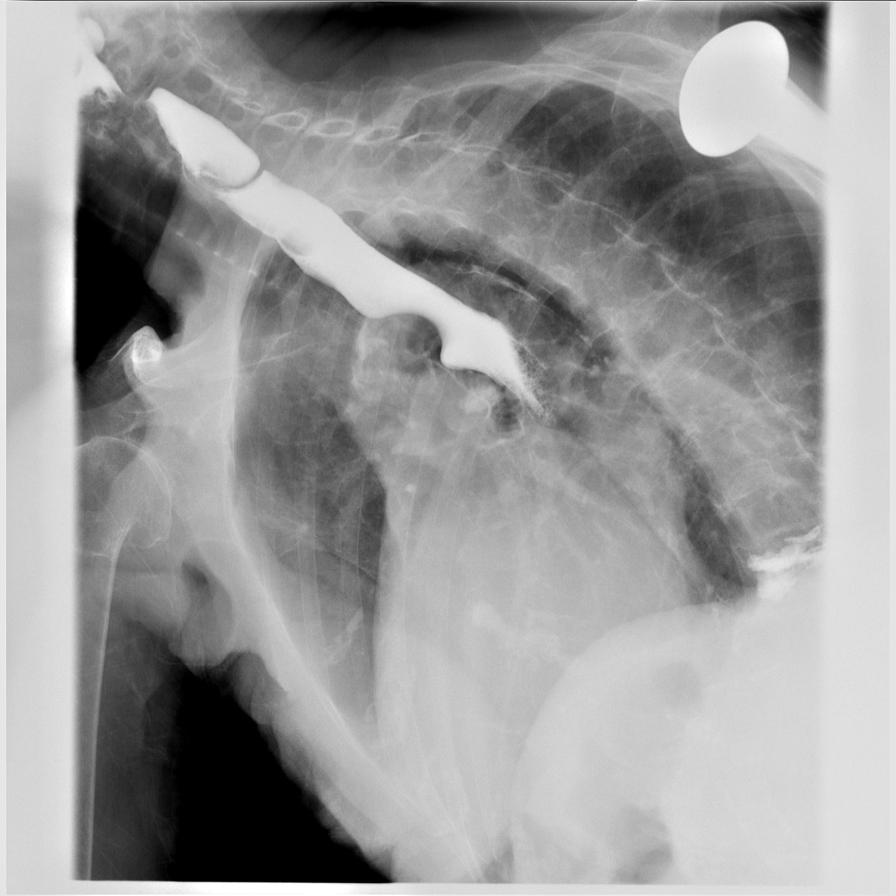

[Series 3: fluoro_barium 2fps_bw · 0.19mm/px · 1 of 1 slices shown (3 of 7)]
[im 1/1]
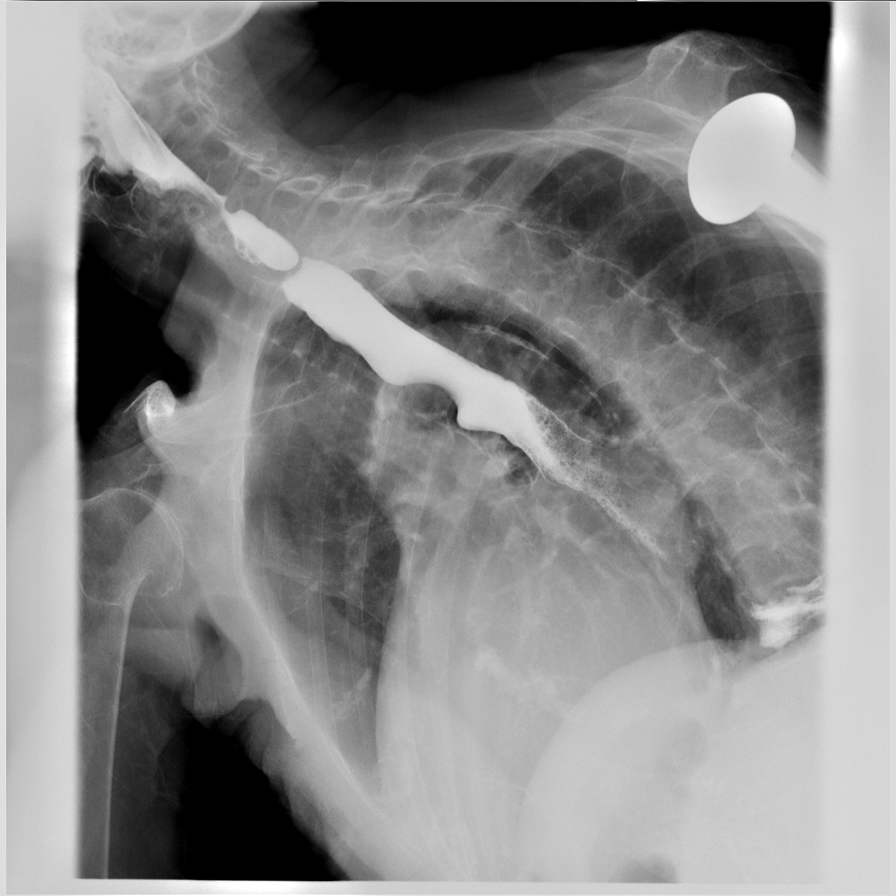

[Series 4: fluoro_barium 2fps_bw · 0.19mm/px · 1 of 1 slices shown (4 of 7)]
[im 1/1]
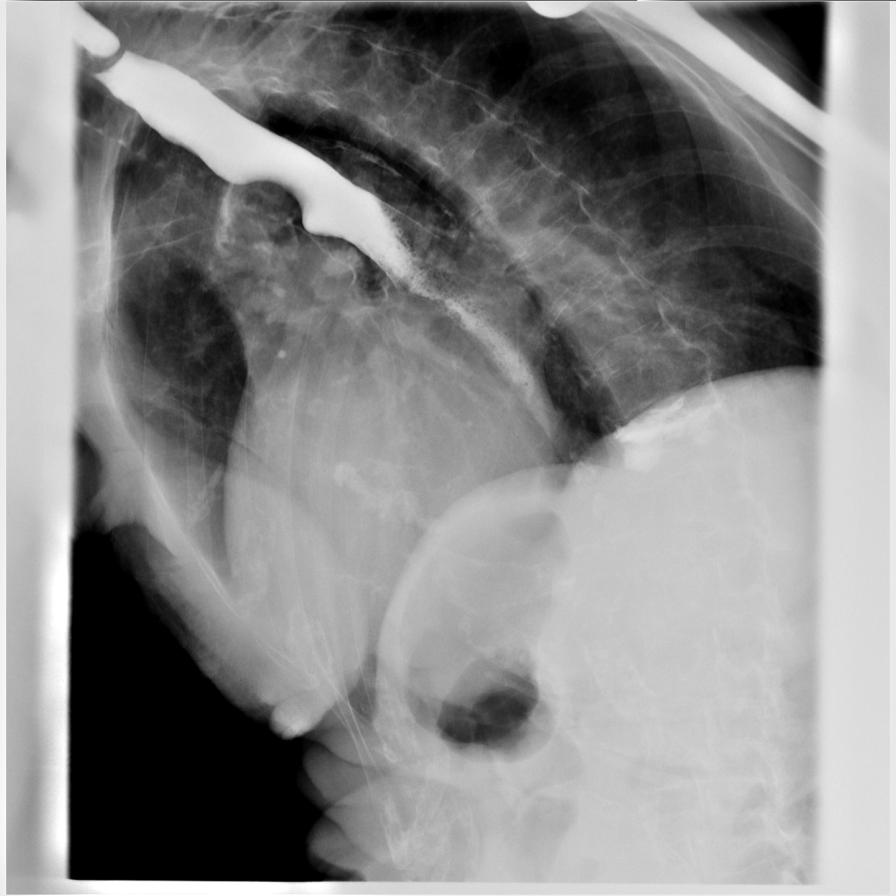

[Series 5: fluoro_barium 2fps_bw · 0.19mm/px · 1 of 1 slices shown (5 of 7)]
[im 1/1]
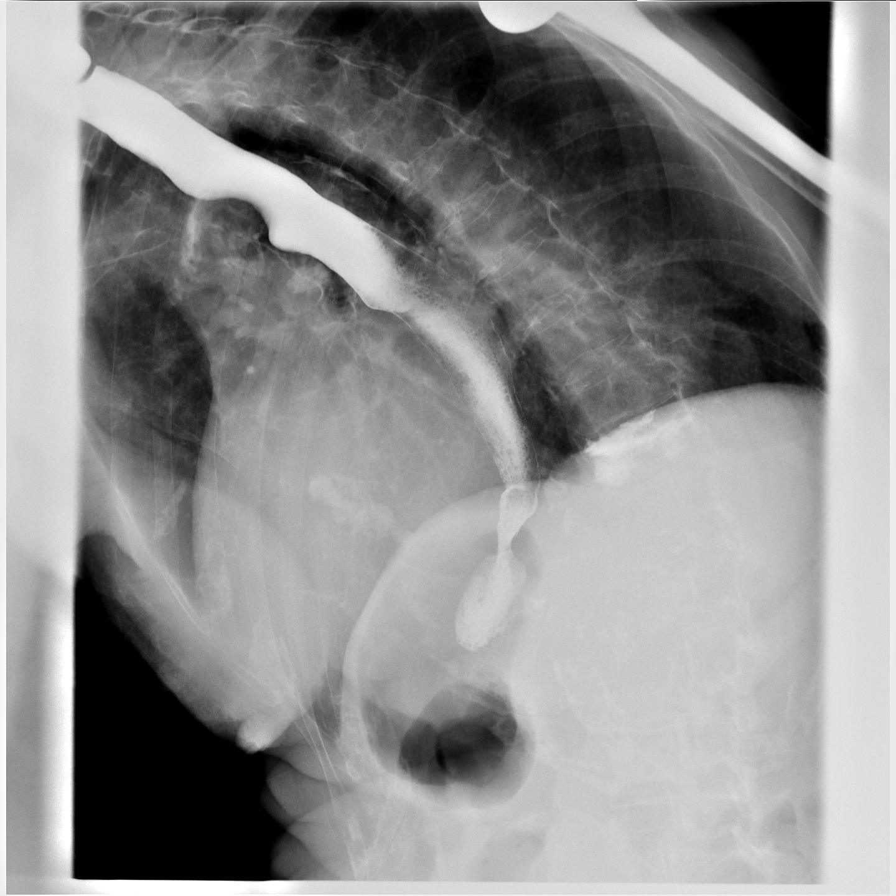

[Series 6: fluoro_barium 2fps_bw · 0.19mm/px · 1 of 1 slices shown (6 of 7)]
[im 1/1]
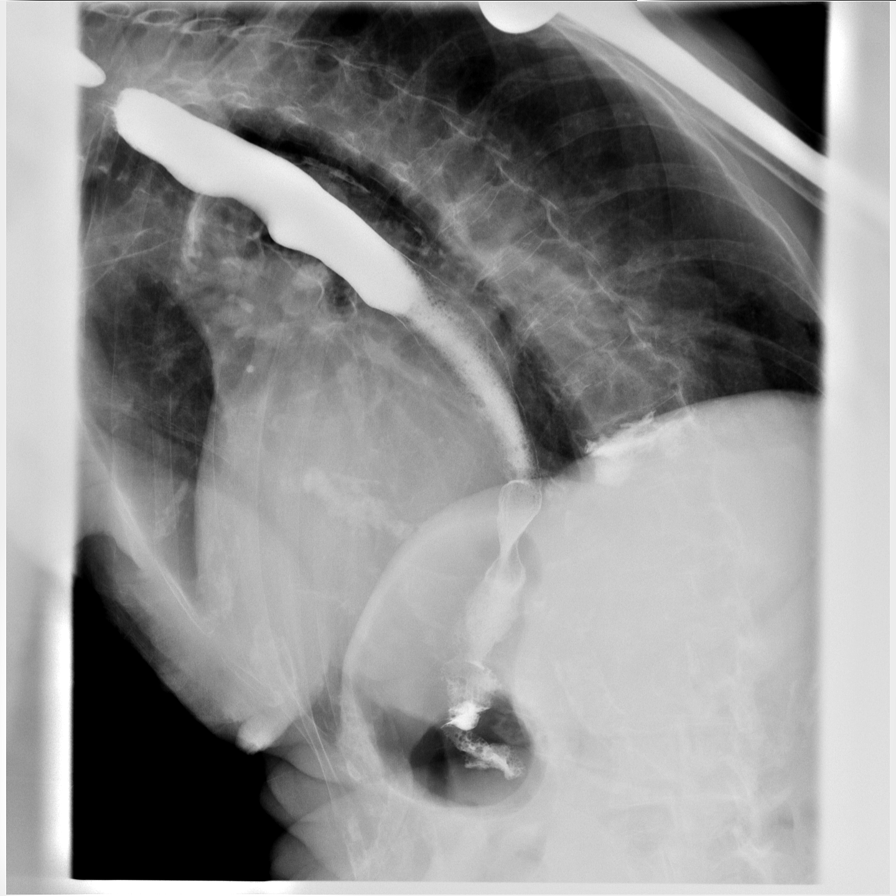

[Series 7: fluoro_barium 2fps_bw · 0.20mm/px · 1 of 1 slices shown (7 of 7)]
[im 1/1]
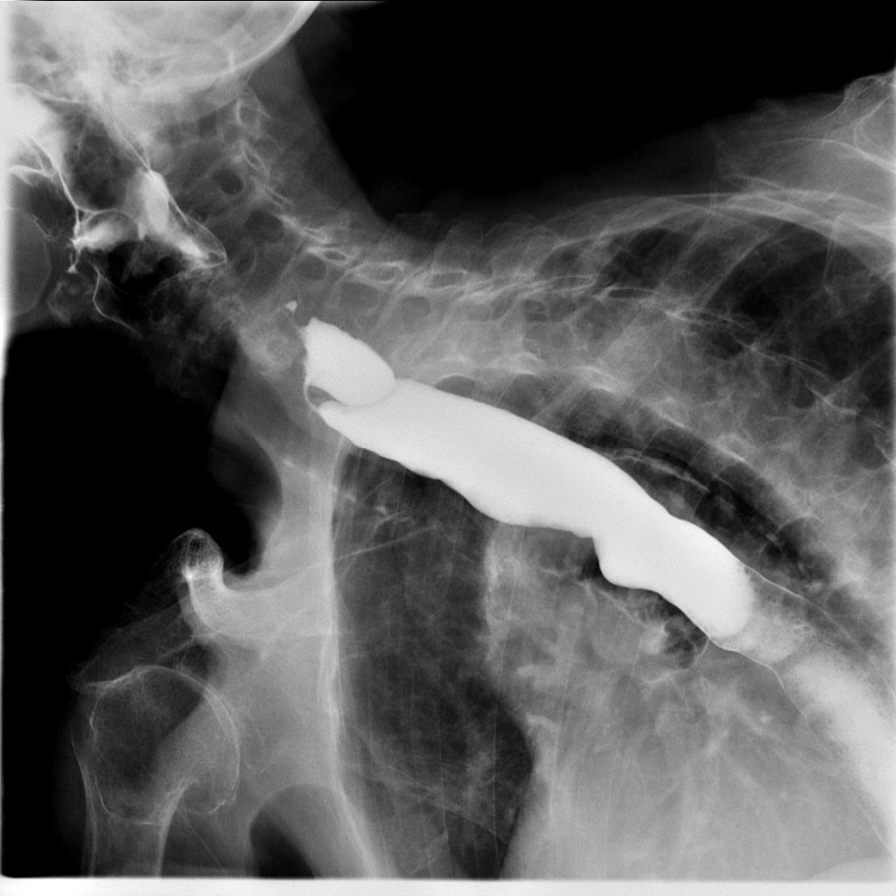

[7 of 7 positions shown; findings below may reference images not displayed]

IMPRESSION: A large Billiot diverticulum with a prominent upper
esophageal web. Gastroenterology consultation suggested.
